# Patient Record
Sex: Female | Born: 1975
Health system: Southern US, Community
[De-identification: ages and names within clinical notes are randomized; demographics above are authoritative.]

## PROBLEM LIST (undated history)

## (undated) DIAGNOSIS — F329 Major depressive disorder, single episode, unspecified: Secondary | ICD-10-CM

## (undated) DIAGNOSIS — F32A Depression, unspecified: Secondary | ICD-10-CM

## (undated) DIAGNOSIS — F419 Anxiety disorder, unspecified: Secondary | ICD-10-CM

## (undated) HISTORY — DX: Anxiety disorder, unspecified: F41.9

## (undated) HISTORY — DX: Major depressive disorder, single episode, unspecified: F32.9

## (undated) HISTORY — DX: Depression, unspecified: F32.A

---

## 2008-11-28 ENCOUNTER — Ambulatory Visit: Payer: Self-pay | Admitting: Family Medicine

## 2013-08-05 DIAGNOSIS — F339 Major depressive disorder, recurrent, unspecified: Secondary | ICD-10-CM | POA: Insufficient documentation

## 2013-08-05 DIAGNOSIS — F411 Generalized anxiety disorder: Secondary | ICD-10-CM | POA: Insufficient documentation

## 2013-08-05 DIAGNOSIS — M1990 Unspecified osteoarthritis, unspecified site: Secondary | ICD-10-CM | POA: Insufficient documentation

## 2013-08-05 DIAGNOSIS — L409 Psoriasis, unspecified: Secondary | ICD-10-CM | POA: Insufficient documentation

## 2015-04-27 DIAGNOSIS — Z139 Encounter for screening, unspecified: Secondary | ICD-10-CM | POA: Diagnosis not present

## 2015-04-27 DIAGNOSIS — Z1231 Encounter for screening mammogram for malignant neoplasm of breast: Secondary | ICD-10-CM | POA: Diagnosis not present

## 2015-04-30 DIAGNOSIS — H6121 Impacted cerumen, right ear: Secondary | ICD-10-CM | POA: Diagnosis not present

## 2015-04-30 DIAGNOSIS — Z6830 Body mass index (BMI) 30.0-30.9, adult: Secondary | ICD-10-CM | POA: Diagnosis not present

## 2015-04-30 DIAGNOSIS — F411 Generalized anxiety disorder: Secondary | ICD-10-CM | POA: Diagnosis not present

## 2015-11-06 DIAGNOSIS — Z23 Encounter for immunization: Secondary | ICD-10-CM | POA: Diagnosis not present

## 2015-11-24 DIAGNOSIS — G8929 Other chronic pain: Secondary | ICD-10-CM | POA: Diagnosis not present

## 2015-11-24 DIAGNOSIS — M25521 Pain in right elbow: Secondary | ICD-10-CM | POA: Diagnosis not present

## 2015-11-24 DIAGNOSIS — J40 Bronchitis, not specified as acute or chronic: Secondary | ICD-10-CM | POA: Diagnosis not present

## 2015-11-24 DIAGNOSIS — J329 Chronic sinusitis, unspecified: Secondary | ICD-10-CM | POA: Diagnosis not present

## 2016-01-28 DIAGNOSIS — R05 Cough: Secondary | ICD-10-CM | POA: Diagnosis not present

## 2016-04-01 DIAGNOSIS — J0100 Acute maxillary sinusitis, unspecified: Secondary | ICD-10-CM | POA: Diagnosis not present

## 2016-04-01 DIAGNOSIS — M722 Plantar fascial fibromatosis: Secondary | ICD-10-CM | POA: Diagnosis not present

## 2016-04-01 DIAGNOSIS — F411 Generalized anxiety disorder: Secondary | ICD-10-CM | POA: Diagnosis not present

## 2016-04-01 DIAGNOSIS — F329 Major depressive disorder, single episode, unspecified: Secondary | ICD-10-CM | POA: Diagnosis not present

## 2016-05-03 DIAGNOSIS — Z1231 Encounter for screening mammogram for malignant neoplasm of breast: Secondary | ICD-10-CM | POA: Diagnosis not present

## 2016-06-24 DIAGNOSIS — H9192 Unspecified hearing loss, left ear: Secondary | ICD-10-CM | POA: Diagnosis not present

## 2016-06-24 DIAGNOSIS — Z6833 Body mass index (BMI) 33.0-33.9, adult: Secondary | ICD-10-CM | POA: Diagnosis not present

## 2016-06-24 DIAGNOSIS — M722 Plantar fascial fibromatosis: Secondary | ICD-10-CM | POA: Diagnosis not present

## 2016-10-24 DIAGNOSIS — Z23 Encounter for immunization: Secondary | ICD-10-CM | POA: Diagnosis not present

## 2017-02-01 NOTE — Progress Notes (Signed)
Subjective:    Patient ID: Dawn Lutz, female    DOB: 1975-11-29, 42 y.o.   MRN: [id number]  HPI:  Dawn Lutz is here to establish as a new pt.  She is a pleasant 42 year old female.  PMH: Denies chronic medical conditions/daily rx medications. She has two complaints: 1) R knee pain- she sustained twisting/fall injury 5 months ago.  Pain/swelling resolved then about 2-3 months ago sx's returned and have now been occurring every 1.5- 2 weeks. When pain is present it is described as "aching/throbbing" and rated 8/10.  She also reports when knee pain/edema develops she will briefly exp low grade fever- recorded as high as 99.8 She denies any previous knee pain/injuries.  She denies numbness/tingling in R foot 2) L plantar fasciitis- dx'd 12 months ago.  Been treated with ice, "rolling", brief course in PT, however pain still present. She has not had full physical in years. She has lost >5 lbs in last 3 months with improved diet. She denies tobacco use and enjoys "a beer/night". She works remotely for [employer] and lives/care for her ailing parents.  Patient Care Team    Relationship Specialty Notifications Start End  Julaine Fusi, NP PCP - General Family Medicine  02/02/17     Patient Active Problem List   Diagnosis Date Noted  . Healthcare maintenance 02/02/2017  . Family history of diabetes mellitus 02/02/2017  . Recurrent pain of right knee 02/02/2017  . Plantar fasciitis, left 02/02/2017     History reviewed. No pertinent past medical history.   History reviewed. No pertinent surgical history.   Family History  Problem Relation Age of Onset  . Depression Mother   . Depression Father   . Alcohol abuse Maternal Grandfather   . Diabetes Paternal Grandfather      Social History   Substance and Sexual Activity  Drug Use No     Social History   Substance and Sexual Activity  Alcohol Use Yes  . Alcohol/week: 4.2 oz  . Types: 7 Cans of beer per  week     Social History   Tobacco Use  Smoking Status Never Smoker  Smokeless Tobacco Never Used     Outpatient Encounter Medications as of 02/02/2017  Medication Sig  . Multiple Vitamin (MULTIVITAMIN) tablet Take 1 tablet by mouth daily.  . SPRINTEC 28 0.25-35 MG-MCG tablet Take 1 tablet by mouth daily.   No facility-administered encounter medications on file as of 02/02/2017.     Allergies: Escitalopram oxalate and Bupropion  Body mass index is 34.9 kg/m.  Blood pressure 128/70, pulse 83, height 5' 4.25" (1.632 m), weight 204 lb 14.4 oz (92.9 kg), SpO2 94 %.      Review of Systems  Constitutional: Positive for fatigue. Negative for activity change, appetite change, chills, diaphoresis, fever and unexpected weight change.  HENT: Negative for congestion.   Eyes: Negative for visual disturbance.  Respiratory: Negative for cough, chest tightness, shortness of breath, wheezing and stridor.   Cardiovascular: Negative for chest pain, palpitations and leg swelling.  Gastrointestinal: Negative for abdominal distention, abdominal pain, blood in stool, constipation, diarrhea, nausea and vomiting.  Endocrine: Negative for cold intolerance, heat intolerance, polydipsia, polyphagia and polyuria.  Genitourinary: Negative for difficulty urinating and flank pain.  Musculoskeletal: Positive for arthralgias, gait problem, joint swelling and myalgias. Negative for back pain, neck pain and neck stiffness.  Skin: Negative for color change, pallor, rash and wound.  Neurological: Negative for dizziness and headaches.  Hematological:  Does not bruise/bleed easily.  Psychiatric/Behavioral: Positive for sleep disturbance. Negative for decreased concentration, dysphoric mood, hallucinations, self-injury and suicidal ideas. The patient is not nervous/anxious and is not hyperactive.        Objective:   Physical Exam  Constitutional: She is oriented to person, place, and time. She appears  well-developed and well-nourished. No distress.  Cardiovascular: Normal rate, regular rhythm, normal heart sounds and intact distal pulses.  No murmur heard. Pulmonary/Chest: Effort normal and breath sounds normal. No respiratory distress. She has no wheezes. She has no rales. She exhibits no tenderness.  Musculoskeletal: She exhibits edema and tenderness.       Right knee: She exhibits swelling and bony tenderness. She exhibits normal range of motion. Tenderness found. Medial joint line and MCL tenderness noted.  TTP  left plantar fascia  Neurological: She is alert and oriented to person, place, and time.  Skin: Skin is warm and dry. No rash noted. She is not diaphoretic. No erythema. No pallor.  Psychiatric: She has a normal mood and affect. Her behavior is normal. Judgment and thought content normal. Her speech is rapid and/or pressured. Cognition and memory are normal.  Vitals reviewed.         Assessment & Plan:   1. Healthcare maintenance   2. Family history of diabetes mellitus   3. Recurrent pain of right knee   4. Plantar fasciitis, left     Healthcare maintenance Increase water intake, strive for at least 100 ounces/day.   Follow Heart Healthy diet Increase regular exercise.  Recommend at least 30 minutes daily, 5 days per week of walking, jogging, biking, swimming, YouTube/Pinterest workout videos. Podiatry referral placed. Please schedule with complete physical with fasting labs in the next month or two. WELCOME TO THE PRACTICE!  Recurrent pain of right knee Right knee pain- continue resting, icing, elevating when pain/swelling flare's up. If pain/swelling/instablity continues will send for imaging.  Plantar fasciitis, left Podiatry referral placed.    FOLLOW-UP:  Return in about 6 weeks (around 03/16/2017) for CPE, Fasting Labs.

## 2017-02-02 ENCOUNTER — Ambulatory Visit (INDEPENDENT_AMBULATORY_CARE_PROVIDER_SITE_OTHER): Payer: BLUE CROSS/BLUE SHIELD | Admitting: Adult Health

## 2017-02-02 ENCOUNTER — Other Ambulatory Visit: Payer: Self-pay | Admitting: Adult Health

## 2017-02-02 ENCOUNTER — Encounter: Payer: Self-pay | Admitting: Adult Health

## 2017-02-02 VITALS — BP 128/70 | HR 83 | Ht 64.25 in | Wt 204.9 lb

## 2017-02-02 DIAGNOSIS — Z833 Family history of diabetes mellitus: Secondary | ICD-10-CM | POA: Diagnosis not present

## 2017-02-02 DIAGNOSIS — Z0000 Encounter for general adult medical examination without abnormal findings: Secondary | ICD-10-CM | POA: Diagnosis not present

## 2017-02-02 DIAGNOSIS — M722 Plantar fascial fibromatosis: Secondary | ICD-10-CM

## 2017-02-02 DIAGNOSIS — M25561 Pain in right knee: Secondary | ICD-10-CM

## 2017-02-02 NOTE — Assessment & Plan Note (Signed)
Podiatry referral placed.

## 2017-02-02 NOTE — Assessment & Plan Note (Signed)
Increase water intake, strive for at least 100 ounces/day.   Follow Heart Healthy diet Increase regular exercise.  Recommend at least 30 minutes daily, 5 days per week of walking, jogging, biking, swimming, YouTube/Pinterest workout videos. Podiatry referral placed. Please schedule with complete physical with fasting labs in the next month or two. WELCOME TO THE PRACTICE!

## 2017-02-02 NOTE — Assessment & Plan Note (Signed)
Right knee pain- continue resting, icing, elevating when pain/swelling flare's up. If pain/swelling/instablity continues will send for imaging.

## 2017-02-02 NOTE — Patient Instructions (Addendum)
Heart-Healthy Eating Plan Many factors influence your heart health, including eating and exercise habits. Heart (coronary) risk increases with abnormal blood fat (lipid) levels. Heart-healthy meal planning includes limiting unhealthy fats, increasing healthy fats, and making other small dietary changes. This includes maintaining a healthy body weight to help keep lipid levels within a normal range. What is my plan? Your health care provider recommends that you:  Get no more than __25___% of the total calories in your daily diet from fat.  Limit your intake of saturated fat to less than ___5___% of your total calories each day.  Limit the amount of cholesterol in your diet to less than _300__ mg per day.  What types of fat should I choose?  Choose healthy fats more often. Choose monounsaturated and polyunsaturated fats, such as olive oil and canola oil, flaxseeds, walnuts, almonds, and seeds.  Eat more omega-3 fats. Good choices include salmon, mackerel, sardines, tuna, flaxseed oil, and ground flaxseeds. Aim to eat fish at least two times each week.  Limit saturated fats. Saturated fats are primarily found in animal products, such as meats, butter, and cream. Plant sources of saturated fats include palm oil, palm kernel oil, and coconut oil.  Avoid foods with partially hydrogenated oils in them. These contain trans fats. Examples of foods that contain trans fats are stick margarine, some tub margarines, cookies, crackers, and other baked goods. What general guidelines do I need to follow?  Check food labels carefully to identify foods with trans fats or high amounts of saturated fat.  Fill one half of your plate with vegetables and green salads. Eat 4-5 servings of vegetables per day. A serving of vegetables equals 1 cup of raw leafy vegetables,  cup of raw or cooked cut-up vegetables, or  cup of vegetable juice.  Fill one fourth of your plate with whole grains. Look for the word "whole"  as the first word in the ingredient list.  Fill one fourth of your plate with lean protein foods.  Eat 4-5 servings of fruit per day. A serving of fruit equals one medium whole fruit,  cup of dried fruit,  cup of fresh, frozen, or canned fruit, or  cup of 100% fruit juice.  Eat more foods that contain soluble fiber. Examples of foods that contain this type of fiber are apples, broccoli, carrots, beans, peas, and barley. Aim to get 20-30 g of fiber per day.  Eat more home-cooked food and less restaurant, buffet, and fast food.  Limit or avoid alcohol.  Limit foods that are high in starch and sugar.  Avoid fried foods.  Cook foods by using methods other than frying. Baking, boiling, grilling, and broiling are all great options. Other fat-reducing suggestions include: ? Removing the skin from poultry. ? Removing all visible fats from meats. ? Skimming the fat off of stews, soups, and gravies before serving them. ? Steaming vegetables in water or broth.  Lose weight if you are overweight. Losing just 5-10% of your initial body weight can help your overall health and prevent diseases such as diabetes and heart disease.  Increase your consumption of nuts, legumes, and seeds to 4-5 servings per week. One serving of dried beans or legumes equals  cup after being cooked, one serving of nuts equals 1 ounces, and one serving of seeds equals  ounce or 1 tablespoon.  You may need to monitor your salt (sodium) intake, especially if you have high blood pressure. Talk with your health care provider or dietitian to get  more information about reducing sodium. What foods can I eat? Grains  Breads, including [place], white, pita, wheat, raisin, rye, oatmeal, and [place]. Tortillas that are neither fried nor made with lard or trans fat. Low-fat rolls, including hotdog and hamburger buns and English muffins. Biscuits. Muffins. Waffles. Pancakes. Light popcorn. Whole-grain cereals. Flatbread. Melba  toast. Pretzels. Breadsticks. Rusks. Low-fat snacks and crackers, including oyster, saltine, matzo, graham, animal, and rye. Rice and pasta, including brown rice and those that are made with whole wheat. Vegetables All vegetables. Fruits All fruits, but limit coconut. Meats and Other Protein Sources Lean, well-trimmed beef, veal, pork, and lamb. Chicken and [place] without skin. All fish and shellfish. Wild duck, rabbit, pheasant, and venison. Egg whites or low-cholesterol egg substitutes. Dried beans, peas, lentils, and tofu.Seeds and most nuts. Dairy Low-fat or nonfat cheeses, including ricotta, string, and mozzarella. Skim or 1% milk that is liquid, powdered, or evaporated. Buttermilk that is made with low-fat milk. Nonfat or low-fat yogurt. Beverages Mineral water. Diet carbonated beverages. Sweets and Desserts Sherbets and fruit ices. Honey, jam, marmalade, jelly, and syrups. Meringues and gelatins. Pure sugar candy, such as hard candy, jelly beans, gumdrops, mints, marshmallows, and small amounts of dark chocolate. [employer] Eat all sweets and desserts in moderation. Fats and Oils Nonhydrogenated (trans-free) margarines. Vegetable oils, including soybean, sesame, sunflower, olive, peanut, safflower, corn, canola, and cottonseed. Salad dressings or mayonnaise that are made with a vegetable oil. Limit added fats and oils that you use for cooking, baking, salads, and as spreads. Other Cocoa powder. Coffee and tea. All seasonings and condiments. The items listed above may not be a complete list of recommended foods or beverages. Contact your dietitian for more options. What foods are not recommended? Grains Breads that are made with saturated or trans fats, oils, or whole milk. Croissants. Butter rolls. Cheese breads. Sweet rolls. Donuts. Buttered popcorn. Chow mein noodles. High-fat crackers, such as cheese or butter crackers. Meats and Other Protein Sources Fatty meats, such as  hotdogs, short ribs, sausage, spareribs, bacon, ribeye roast or steak, and mutton. High-fat deli meats, such as salami and bologna. Caviar. Domestic duck and goose. Organ meats, such as kidney, liver, sweetbreads, brains, gizzard, chitterlings, and heart. Dairy Cream, sour cream, cream cheese, and creamed cottage cheese. Whole milk cheeses, including blue (bleu), Monte[place] Jack, [place], [place], American, [place], Swiss, [place], [place], and [place]. Whole or 2% milk that is liquid, evaporated, or condensed. Whole buttermilk. Cream sauce or high-fat cheese sauce. Yogurt that is made from whole milk. Beverages Regular sodas and drinks with added sugar. Sweets and Desserts Frosting. Pudding. Cookies. Cakes other than angel food cake. Candy that has milk chocolate or white chocolate, hydrogenated fat, butter, coconut, or unknown ingredients. Buttered syrups. Full-fat ice cream or ice cream drinks. Fats and Oils Gravy that has suet, meat fat, or shortening. Cocoa butter, hydrogenated oils, palm oil, coconut oil, palm kernel oil. These can often be found in baked products, candy, fried foods, nondairy creamers, and whipped toppings. Solid fats and shortenings, including bacon fat, salt pork, lard, and butter. Nondairy cream substitutes, such as coffee creamers and sour cream substitutes. Salad dressings that are made of unknown oils, cheese, or sour cream. The items listed above may not be a complete list of foods and beverages to avoid. Contact your dietitian for more information. This information is not intended to replace advice given to you by your health care provider. Make sure you discuss any questions you have with your health care  provider. Document Released: 10/13/2007 Document Revised: 07/24/2015 Document Reviewed: 06/27/2013 Elsevier Interactive Patient Education  2018 Elsevier Inc.   Plantar Fasciitis Plantar fasciitis is a painful foot condition that affects the heel. It occurs when the band  of tissue that connects the toes to the heel bone (plantar fascia) becomes irritated. This can happen after exercising too much or doing other repetitive activities (overuse injury). The pain from plantar fasciitis can range from mild irritation to severe pain that makes it difficult for you to walk or move. The pain is usually worse in the morning or after you have been sitting or lying down for a while. What are the causes? This condition may be caused by:  Standing for long periods of time.  Wearing shoes that do not fit.  Doing high-impact activities, including running, aerobics, and ballet.  Being overweight.  Having an abnormal way of walking (gait).  Having tight calf muscles.  Having high arches in your feet.  Starting a new athletic activity.  What are the signs or symptoms? The main symptom of this condition is heel pain. Other symptoms include:  Pain that gets worse after activity or exercise.  Pain that is worse in the morning or after resting.  Pain that goes away after you walk for a few minutes.  How is this diagnosed? This condition may be diagnosed based on your signs and symptoms. Your health care provider will also do a physical exam to check for:  A tender area on the bottom of your foot.  A high arch in your foot.  Pain when you move your foot.  Difficulty moving your foot.  You may also need to have imaging studies to confirm the diagnosis. These can include:  X-rays.  Ultrasound.  MRI.  How is this treated? Treatment for plantar fasciitis depends on the severity of the condition. Your treatment may include:  Rest, ice, and over-the-counter pain medicines to manage your pain.  Exercises to stretch your calves and your plantar fascia.  A splint that holds your foot in a stretched, upward position while you sleep (night splint).  Physical therapy to relieve symptoms and prevent problems in the future.  Cortisone injections to relieve  severe pain.  Extracorporeal shock wave therapy (ESWT) to stimulate damaged plantar fascia with electrical impulses. It is often used as a last resort before surgery.  Surgery, if other treatments have not worked after 12 months.  Follow these instructions at home:  Take medicines only as directed by your health care provider.  Avoid activities that cause pain.  Roll the bottom of your foot over a bag of ice or a bottle of cold water. Do this for 20 minutes, 3-4 times a day.  Perform simple stretches as directed by your health care provider.  Try wearing athletic shoes with air-sole or gel-sole cushions or soft shoe inserts.  Wear a night splint while sleeping, if directed by your health care provider.  Keep all follow-up appointments with your health care provider. How is this prevented?  Do not perform exercises or activities that cause heel pain.  Consider finding low-impact activities if you continue to have problems.  Lose weight if you need to. The best way to prevent plantar fasciitis is to avoid the activities that aggravate your plantar fascia. Contact a health care provider if:  Your symptoms do not go away after treatment with home care measures.  Your pain gets worse.  Your pain affects your ability to move or do your  daily activities. This information is not intended to replace advice given to you by your health care provider. Make sure you discuss any questions you have with your health care provider. Document Released: 09/28/2000 Document Revised: 06/08/2015 Document Reviewed: 11/13/2013 Elsevier Interactive Patient Education  2018 Elsevier Inc.   Knee Pain, Adult Knee pain in adults is common. It can be caused by many things, including:  Arthritis.  A fluid-filled sac (cyst) or growth in your knee.  An infection in your knee.  An injury that will not heal.  Damage, swelling, or irritation of the tissues that support your knee.  Knee pain is  usually not a sign of a serious problem. The pain may go away on its own with time and rest. If it does not, a health care provider may order tests to find the cause of the pain. These may include:  Imaging tests, such as an X-ray, MRI, or ultrasound.  Joint aspiration. In this test, fluid is removed from the knee.  Arthroscopy. In this test, a lighted tube is inserted into knee and an image is projected onto a TV screen.  A biopsy. In this test, a sample of tissue is removed from the body and studied under a microscope.  Follow these instructions at home: Pay attention to any changes in your symptoms. Take these actions to relieve your pain. Activity  Rest your knee.  Do not do things that cause pain or make pain worse.  Avoid high-impact activities or exercises, such as running, jumping rope, or doing jumping jacks. General instructions  Take over-the-counter and prescription medicines only as told by your health care provider.  Raise (elevate) your knee above the level of your heart when you are sitting or lying down.  Sleep with a pillow under your knee.  If directed, apply ice to the knee: ? Put ice in a plastic bag. ? Place a towel between your skin and the bag. ? Leave the ice on for 20 minutes, 2-3 times a day.  Ask your health care provider if you should wear an elastic knee support.  Lose weight if you are overweight. Extra weight can put pressure on your knee.  Do not use any products that contain nicotine or tobacco, such as cigarettes and e-cigarettes. Smoking may slow the healing of any bone and joint problems that you may have. If you need help quitting, ask your health care provider. Contact a health care provider if:  Your knee pain continues, changes, or gets worse.  You have a fever along with knee pain.  Your knee buckles or locks up.  Your knee swells, and the swelling becomes worse. Get help right away if:  Your knee feels warm to the  touch.  You cannot move your knee.  You have severe pain in your knee.  You have chest pain.  You have trouble breathing. Summary  Knee pain in adults is common. It can be caused by many things, including, arthritis, infection, cysts, or injury.  Knee pain is usually not a sign of a serious problem, but if it does not go away, a health care provider may perform tests to know the cause of the pain.  Pay attention to any changes in your symptoms. Relieve your pain with rest, medicines, light activity, and use of ice.  Get help if your pain continues or becomes very severe, or if your knee buckles or locks up, or if you have chest pain or trouble breathing. This information is not  intended to replace advice given to you by your health care provider. Make sure you discuss any questions you have with your health care provider. Document Released: 10/31/2006 Document Revised: 12/25/2015 Document Reviewed: 12/25/2015 Elsevier Interactive Patient Education  2018 [employer]  Increase water intake, strive for at least 100 ounces/day.   Follow Heart Healthy diet Increase regular exercise.  Recommend at least 30 minutes daily, 5 days per week of walking, jogging, biking, swimming, YouTube/Pinterest workout videos. Podiatry referral placed. Please schedule with complete physical with fasting labs in the next month or two. Right knee pain- continue resting, icing, elevating when pain/swelling flare's up. WELCOME TO THE PRACTICE!

## 2017-02-28 ENCOUNTER — Ambulatory Visit: Payer: BLUE CROSS/BLUE SHIELD | Admitting: Podiatry

## 2017-03-07 ENCOUNTER — Other Ambulatory Visit: Payer: Self-pay | Admitting: Podiatry

## 2017-03-07 ENCOUNTER — Ambulatory Visit (INDEPENDENT_AMBULATORY_CARE_PROVIDER_SITE_OTHER): Payer: BLUE CROSS/BLUE SHIELD | Admitting: Podiatry

## 2017-03-07 ENCOUNTER — Encounter: Payer: Self-pay | Admitting: Podiatry

## 2017-03-07 ENCOUNTER — Ambulatory Visit (INDEPENDENT_AMBULATORY_CARE_PROVIDER_SITE_OTHER): Payer: BLUE CROSS/BLUE SHIELD

## 2017-03-07 DIAGNOSIS — M722 Plantar fascial fibromatosis: Secondary | ICD-10-CM

## 2017-03-07 DIAGNOSIS — M779 Enthesopathy, unspecified: Secondary | ICD-10-CM

## 2017-03-07 MED ORDER — MELOXICAM 15 MG PO TABS: 15.0000 mg | ORAL_TABLET | Freq: Every day | ORAL | 1 refills | Status: AC

## 2017-03-09 NOTE — Progress Notes (Signed)
   Subjective: Patient presents today for pain and tenderness in the left arch that began several months ago. She states that it hurts in the mornings with the first steps out of bed. She has not done anything to treat the symptoms. Patient presents today for further treatment and evaluation.  No past medical history on file.   Objective: Physical Exam General: The patient is alert and oriented x3 in no acute distress.  Dermatology: Skin is warm, dry and supple bilateral lower extremities. Negative for open lesions or macerations bilateral.   Vascular: Dorsalis Pedis and Posterior Tibial pulses palpable bilateral.  Capillary fill time is immediate to all digits.  Neurological: Epicritic and protective threshold intact bilateral.   Musculoskeletal: Tenderness to palpation at the medial calcaneal tubercale and through the insertion of the plantar fascia of the left foot. All other joints range of motion within normal limits bilateral. Strength 5/5 in all groups bilateral.   Radiographic exam:   Normal osseous mineralization. Joint spaces preserved. No fracture/dislocation/boney destruction. Calcaneal spur present with mild thickening of plantar fascia left. No other soft tissue abnormalities or radiopaque foreign bodies.   Assessment: 1. Plantar fasciitis left foot  Plan of Care:  1. Patient evaluated. Xrays reviewed.   2. Injection of 0.5cc Celestone soluspan injected into the left plantar fascia.  3. Rx for Mobic 15 mg ordered for patient. 4. Plantar fascial band(s) dispensed  5. Instructed patient regarding therapies and modalities at home to alleviate symptoms.  6. Return to clinic in 4 weeks.    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    Felecia ShellingBrent M. Evans, DPM [facility]  Dr. Felecia ShellingBrent M. Evans, DPM    [address]                                     [place], [place] [id number]                Office [phone]  Fax [phone]

## 2017-03-16 ENCOUNTER — Other Ambulatory Visit: Payer: Self-pay

## 2017-03-16 ENCOUNTER — Other Ambulatory Visit (INDEPENDENT_AMBULATORY_CARE_PROVIDER_SITE_OTHER): Payer: BLUE CROSS/BLUE SHIELD

## 2017-03-16 DIAGNOSIS — Z0000 Encounter for general adult medical examination without abnormal findings: Secondary | ICD-10-CM

## 2017-03-16 DIAGNOSIS — Z833 Family history of diabetes mellitus: Secondary | ICD-10-CM

## 2017-03-17 LAB — VITAMIN D 25 HYDROXY (VIT D DEFICIENCY, FRACTURES): Vit D, 25-Hydroxy: 38.7 ng/mL (ref 30.0–100.0)

## 2017-03-17 LAB — CBC WITH DIFFERENTIAL/PLATELET
Basophils Absolute: 0 10*3/uL (ref 0.0–0.2)
Basos: 0 %
EOS (ABSOLUTE): 0.3 10*3/uL (ref 0.0–0.4)
Eos: 3 %
Hematocrit: 40.5 % (ref 34.0–46.6)
Hemoglobin: 13.5 g/dL (ref 11.1–15.9)
Immature Grans (Abs): 0 10*3/uL (ref 0.0–0.1)
Immature Granulocytes: 0 %
Lymphocytes Absolute: 3 10*3/uL (ref 0.7–3.1)
Lymphs: 34 %
MCH: 31.5 pg (ref 26.6–33.0)
MCHC: 33.3 g/dL (ref 31.5–35.7)
MCV: 94 fL (ref 79–97)
Monocytes Absolute: 0.6 10*3/uL (ref 0.1–0.9)
Monocytes: 7 %
Neutrophils Absolute: 5 10*3/uL (ref 1.4–7.0)
Neutrophils: 56 %
Platelets: 301 10*3/uL (ref 150–379)
RBC: 4.29 x10E6/uL (ref 3.77–5.28)
RDW: 13.8 % (ref 12.3–15.4)
WBC: 8.8 10*3/uL (ref 3.4–10.8)

## 2017-03-17 LAB — COMPREHENSIVE METABOLIC PANEL
ALT: 14 IU/L (ref 0–32)
AST: 13 IU/L (ref 0–40)
Albumin/Globulin Ratio: 1.5 (ref 1.2–2.2)
Albumin: 3.9 g/dL (ref 3.5–5.5)
Alkaline Phosphatase: 60 IU/L (ref 39–117)
BUN/Creatinine Ratio: 13 (ref 9–23)
BUN: 9 mg/dL (ref 6–24)
Bilirubin Total: 0.2 mg/dL (ref 0.0–1.2)
CO2: 23 mmol/L (ref 20–29)
Calcium: 9 mg/dL (ref 8.7–10.2)
Chloride: 105 mmol/L (ref 96–106)
Creatinine, Ser: 0.69 mg/dL (ref 0.57–1.00)
GFR calc Af Amer: 122 mL/min/{1.73_m2} (ref >59)
GFR calc non Af Amer: 106 mL/min/{1.73_m2} (ref >59)
Globulin, Total: 2.6 g/dL (ref 1.5–4.5)
Glucose: 87 mg/dL (ref 65–99)
Potassium: 4.5 mmol/L (ref 3.5–5.2)
Sodium: 140 mmol/L (ref 134–144)
Total Protein: 6.5 g/dL (ref 6.0–8.5)

## 2017-03-17 LAB — TSH: TSH: 4.28 u[IU]/mL (ref 0.450–4.500)

## 2017-03-17 LAB — HEMOGLOBIN A1C
Est. average glucose Bld gHb Est-mCnc: 105 mg/dL
Hgb A1c MFr Bld: 5.3 % (ref 4.8–5.6)

## 2017-03-17 LAB — LIPID PANEL
Chol/HDL Ratio: 3 ratio (ref 0.0–4.4)
Cholesterol, Total: 190 mg/dL (ref 100–199)
HDL: 64 mg/dL (ref >39)
LDL Calculated: 89 mg/dL (ref 0–99)
Triglycerides: 186 mg/dL — ABNORMAL HIGH (ref 0–149)
VLDL Cholesterol Cal: 37 mg/dL (ref 5–40)

## 2017-03-22 NOTE — Progress Notes (Signed)
Subjective:    Patient ID: Dawn Lutz, female    DOB: 1975-12-31, 42 y.o.   MRN: [id number]  HPI: 02/02/17 OV:  Dawn Lutz is here to establish as a new pt.  She is a pleasant 42 year old female.  PMH: Denies chronic medical conditions/daily rx medications. She has two complaints: 1) R knee pain- she sustained twisting/fall injury 5 months ago.  Pain/swelling resolved then about 2-3 months ago sx's returned and have now been occurring every 1.5- 2 weeks. When pain is present it is described as "aching/throbbing" and rated 8/10.  She also reports when knee pain/edema develops she will briefly exp low grade fever- recorded as high as 99.8 She denies any previous knee pain/injuries.  She denies numbness/tingling in R foot 2) L plantar fasciitis- dx'd 12 months ago.  Been treated with ice, "rolling", brief course in PT, however pain still present. She has not had full physical in years. She has lost >5 lbs in last 3 months with improved diet. She denies tobacco use and enjoys "a beer/night". She works remotely for [employer] and lives/care for her ailing parents.  03/22/17 OV: Dawn Lutz presents for CPE. She was seen by podiatrist 03/07/17- 0.505ml Celestone soluspan injected into L plantar fascia and started on Meloxicam 15mg  QD She reports dramatic reduction in L plantar and R knee pain. She has been able to resume regular walking 4/5 miles 5 days/week-GREAT! She follows heart healthy diet with the occasional splurge on "bread and potatoes". She continues to enjoy beer a few evenings a week Reviewed recent labs- all questions answered  Healthcare Maintenance: PAP- completed today Mammogram- ordered today   Patient Care Team    Relationship Specialty Notifications Start End  Julaine Fusianford, Tania Perrott D, NP PCP - General Family Medicine  02/02/17     Patient Active Problem List   Diagnosis Date Noted  . Screening for cervical cancer 03/23/2017  . Breast cancer screening  03/23/2017  . Healthcare maintenance 02/02/2017  . Family history of diabetes mellitus 02/02/2017  . Recurrent pain of right knee 02/02/2017  . Plantar fasciitis, left 02/02/2017  . Arthritis 08/05/2013  . Depression 08/05/2013  . Generalized anxiety disorder 08/05/2013  . Psoriasis 08/05/2013     History reviewed. No pertinent past medical history.   History reviewed. No pertinent surgical history.   Family History  Problem Relation Age of Onset  . Depression Mother   . Depression Father   . Alcohol abuse Maternal Grandfather   . Diabetes Paternal Grandfather      Social History   Substance and Sexual Activity  Drug Use No     Social History   Substance and Sexual Activity  Alcohol Use Yes  . Alcohol/week: 4.2 oz  . Types: 7 Cans of beer per week     Social History   Tobacco Use  Smoking Status Never Smoker  Smokeless Tobacco Never Used     Outpatient Encounter Medications as of 03/23/2017  Medication Sig  . meloxicam (MOBIC) 15 MG tablet Take 1 tablet (15 mg total) by mouth daily.  . Multiple Vitamin (MULTIVITAMIN) tablet Take 1 tablet by mouth daily.  . SPRINTEC 28 0.25-35 MG-MCG tablet Take 1 tablet by mouth daily.   No facility-administered encounter medications on file as of 03/23/2017.     Allergies: Escitalopram oxalate and Bupropion  Body mass index is 34.85 kg/m.  Blood pressure 112/80, pulse 85, height 5' 4.25" (1.632 m), weight 204 lb 9.6 oz (92.8 kg), SpO2 98 %.  Review of Systems  Constitutional: Positive for fatigue. Negative for activity change, appetite change, chills, diaphoresis, fever and unexpected weight change.  HENT: Negative for congestion.   Eyes: Negative for visual disturbance.  Respiratory: Negative for cough, chest tightness, shortness of breath, wheezing and stridor.   Cardiovascular: Negative for chest pain, palpitations and leg swelling.  Gastrointestinal: Negative for abdominal distention, abdominal pain, blood in  stool, constipation, diarrhea, nausea and vomiting.  Endocrine: Negative for cold intolerance, heat intolerance, polydipsia, polyphagia and polyuria.  Genitourinary: Negative for difficulty urinating and flank pain.  Musculoskeletal: Positive for arthralgias, gait problem, joint swelling and myalgias. Negative for back pain, neck pain and neck stiffness.  Skin: Negative for color change, pallor, rash and wound.  Neurological: Negative for dizziness and headaches.  Hematological: Does not bruise/bleed easily.  Psychiatric/Behavioral: Positive for sleep disturbance. Negative for decreased concentration, dysphoric mood, hallucinations, self-injury and suicidal ideas. The patient is not nervous/anxious and is not hyperactive.        Objective:   Physical Exam  Constitutional: She is oriented to person, place, and time. She appears well-developed and well-nourished. No distress.  HENT:  Head: Normocephalic and atraumatic.  Right Ear: Hearing, tympanic membrane, external ear and ear canal normal. Tympanic membrane is not erythematous and not bulging. No decreased hearing is noted.  Left Ear: Hearing, tympanic membrane, external ear and ear canal normal. Tympanic membrane is not erythematous and not bulging. No decreased hearing is noted.  Nose: Nose normal. No mucosal edema or rhinorrhea. Right sinus exhibits no maxillary sinus tenderness and no frontal sinus tenderness. Left sinus exhibits no maxillary sinus tenderness and no frontal sinus tenderness.  Mouth/Throat: Uvula is midline, oropharynx is clear and moist and mucous membranes are normal.  Eyes: Conjunctivae are normal. Pupils are equal, round, and reactive to light.  Neck: Normal range of motion. Neck supple.  Cardiovascular: Normal rate, regular rhythm, normal heart sounds and intact distal pulses.  No murmur heard. Pulmonary/Chest: Effort normal and breath sounds normal. No respiratory distress. She has no wheezes. She has no rales. She  exhibits no tenderness.  Abdominal: Soft. Bowel sounds are normal. She exhibits no distension and no mass. There is no tenderness. There is no rebound and no guarding. Hernia confirmed negative in the left inguinal area.  Genitourinary: There is no tenderness or lesion on the right labia. There is no tenderness or lesion on the left labia. No tenderness in the vagina. Vaginal discharge found.  Genitourinary Comments: Thick/white vaginal discharge  Musculoskeletal: She exhibits no edema or tenderness.       Right knee: She exhibits swelling and bony tenderness. She exhibits normal range of motion.  Lymphadenopathy:    She has no cervical adenopathy.  Neurological: She is alert and oriented to person, place, and time.  Skin: Skin is warm and dry. No rash noted. She is not diaphoretic. No erythema. No pallor.  Psychiatric: She has a normal mood and affect. Her behavior is normal. Judgment and thought content normal. Her speech is rapid and/or pressured. Cognition and memory are normal.  Vitals reviewed.         Assessment & Plan:   1. Need for HPV vaccination   2. Breast cancer screening   3. Screening for cervical cancer   4. Psoriasis   5. Healthcare maintenance     Psoriasis Treated with UV exposure via tanning bed She reports using sunscreen when tanning  Breast cancer screening Mammogram ordered  Healthcare maintenance Your recent lab work looks  good, blood pressure is excellent. GREAT JOB on resuming the walking program! We will call you when PAP results are available. Recommend annul physical with fasting labs.  Screening for cervical cancer PAP completed today    FOLLOW-UP:  Return in about 1 year (around 03/24/2018) for CPE, Fasting Labs.

## 2017-03-23 ENCOUNTER — Ambulatory Visit (INDEPENDENT_AMBULATORY_CARE_PROVIDER_SITE_OTHER): Payer: BLUE CROSS/BLUE SHIELD | Admitting: Adult Health

## 2017-03-23 ENCOUNTER — Encounter: Payer: Self-pay | Admitting: Adult Health

## 2017-03-23 ENCOUNTER — Other Ambulatory Visit (HOSPITAL_COMMUNITY)
Admission: RE | Admit: 2017-03-23 | Discharge: 2017-03-23 | Disposition: A | Discharge status: Home / Self Care | Payer: BLUE CROSS/BLUE SHIELD | Source: Ambulatory Visit | Attending: Adult Health | Admitting: Adult Health

## 2017-03-23 ENCOUNTER — Other Ambulatory Visit: Payer: Self-pay | Admitting: Adult Health

## 2017-03-23 VITALS — BP 112/80 | HR 85 | Ht 64.25 in | Wt 204.6 lb

## 2017-03-23 DIAGNOSIS — Z0000 Encounter for general adult medical examination without abnormal findings: Secondary | ICD-10-CM | POA: Diagnosis not present

## 2017-03-23 DIAGNOSIS — Z1231 Encounter for screening mammogram for malignant neoplasm of breast: Secondary | ICD-10-CM | POA: Diagnosis not present

## 2017-03-23 DIAGNOSIS — Z23 Encounter for immunization: Secondary | ICD-10-CM

## 2017-03-23 DIAGNOSIS — L409 Psoriasis, unspecified: Secondary | ICD-10-CM

## 2017-03-23 DIAGNOSIS — Z1239 Encounter for other screening for malignant neoplasm of breast: Secondary | ICD-10-CM

## 2017-03-23 DIAGNOSIS — Z124 Encounter for screening for malignant neoplasm of cervix: Secondary | ICD-10-CM | POA: Insufficient documentation

## 2017-03-23 NOTE — Assessment & Plan Note (Signed)
Mammogram ordered

## 2017-03-23 NOTE — Assessment & Plan Note (Signed)
Your recent lab work looks good, blood pressure is excellent. GREAT JOB on resuming the walking program! We will call you when PAP results are available. Recommend annul physical with fasting labs.

## 2017-03-23 NOTE — Assessment & Plan Note (Signed)
PAP completed today 

## 2017-03-23 NOTE — Patient Instructions (Signed)
Heart-Healthy Eating Plan Heart-healthy meal planning includes:  Limiting unhealthy fats.  Increasing healthy fats.  Making other small dietary changes.  You may need to talk with your doctor or a diet specialist (dietitian) to create an eating plan that is right for you. What types of fat should I choose?  Choose healthy fats. These include olive oil and canola oil, flaxseeds, walnuts, almonds, and seeds.  Eat more omega-3 fats. These include salmon, mackerel, sardines, tuna, flaxseed oil, and ground flaxseeds. Try to eat fish at least twice each week.  Limit saturated fats. ? Saturated fats are often found in animal products, such as meats, butter, and cream. ? Plant sources of saturated fats include palm oil, palm kernel oil, and coconut oil.  Avoid foods with partially hydrogenated oils in them. These include stick margarine, some tub margarines, cookies, crackers, and other baked goods. These contain trans fats. What general guidelines do I need to follow?  Check food labels carefully. Identify foods with trans fats or high amounts of saturated fat.  Fill one half of your plate with vegetables and green salads. Eat 4-5 servings of vegetables per day. A serving of vegetables is: ? 1 cup of raw leafy vegetables. ?  cup of raw or cooked cut-up vegetables. ?  cup of vegetable juice.  Fill one fourth of your plate with whole grains. Look for the word "whole" as the first word in the ingredient list.  Fill one fourth of your plate with lean protein foods.  Eat 4-5 servings of fruit per day. A serving of fruit is: ? One medium whole fruit. ?  cup of dried fruit. ?  cup of fresh, frozen, or canned fruit. ?  cup of 100% fruit juice.  Eat more foods that contain soluble fiber. These include apples, broccoli, carrots, beans, peas, and barley. Try to get 20-30 g of fiber per day.  Eat more home-cooked food. Eat less restaurant, buffet, and fast food.  Limit or avoid  alcohol.  Limit foods high in starch and sugar.  Avoid fried foods.  Avoid frying your food. Try baking, boiling, grilling, or broiling it instead. You can also reduce fat by: ? Removing the skin from poultry. ? Removing all visible fats from meats. ? Skimming the fat off of stews, soups, and gravies before serving them. ? Steaming vegetables in water or broth.  Lose weight if you are overweight.  Eat 4-5 servings of nuts, legumes, and seeds per week: ? One serving of dried beans or legumes equals  cup after being cooked. ? One serving of nuts equals 1 ounces. ? One serving of seeds equals  ounce or one tablespoon.  You may need to keep track of how much salt or sodium you eat. This is especially true if you have high blood pressure. Talk with your doctor or dietitian to get more information. What foods can I eat? Grains Breads, including French, white, pita, wheat, raisin, rye, oatmeal, and Italian. Tortillas that are neither fried nor made with lard or trans fat. Low-fat rolls, including hotdog and hamburger buns and English muffins. Biscuits. Muffins. Waffles. Pancakes. Light popcorn. Whole-grain cereals. Flatbread. Melba toast. Pretzels. Breadsticks. Rusks. Low-fat snacks. Low-fat crackers, including oyster, saltine, matzo, graham, animal, and rye. Rice and pasta, including brown rice and pastas that are made with whole wheat. Vegetables All vegetables. Fruits All fruits, but limit coconut. Meats and Other Protein Sources Lean, well-trimmed beef, veal, pork, and lamb. Chicken and turkey without skin. All fish and shellfish.   Wild duck, rabbit, pheasant, and venison. Egg whites or low-cholesterol egg substitutes. Dried beans, peas, lentils, and tofu. Seeds and most nuts. Dairy Low-fat or nonfat cheeses, including ricotta, string, and mozzarella. Skim or 1% milk that is liquid, powdered, or evaporated. Buttermilk that is made with low-fat milk. Nonfat or low-fat  yogurt. Beverages Mineral water. Diet carbonated beverages. Sweets and Desserts Sherbets and fruit ices. Honey, jam, marmalade, jelly, and syrups. Meringues and gelatins. Pure sugar candy, such as hard candy, jelly beans, gumdrops, mints, marshmallows, and small amounts of dark chocolate. [employer] food cake. Eat all sweets and desserts in moderation. Fats and Oils Nonhydrogenated (trans-free) margarines. Vegetable oils, including soybean, sesame, sunflower, olive, peanut, safflower, corn, canola, and cottonseed. Salad dressings or mayonnaise made with a vegetable oil. Limit added fats and oils that you use for cooking, baking, salads, and as spreads. Other Cocoa powder. Coffee and tea. All seasonings and condiments. The items listed above may not be a complete list of recommended foods or beverages. Contact your dietitian for more options. What foods are not recommended? Grains Breads that are made with saturated or trans fats, oils, or whole milk. Croissants. Butter rolls. Cheese breads. Sweet rolls. Donuts. Buttered popcorn. Chow mein noodles. High-fat crackers, such as cheese or butter crackers. Meats and Other Protein Sources Fatty meats, such as hotdogs, short ribs, sausage, spareribs, bacon, rib eye roast or steak, and mutton. High-fat deli meats, such as salami and bologna. Caviar. Domestic duck and goose. Organ meats, such as kidney, liver, sweetbreads, and heart. Dairy Cream, sour cream, cream cheese, and creamed cottage cheese. Whole-milk cheeses, including blue (bleu), [address], [place], [place], [address], [place], [address], cheddar, [place], and [place]. Whole or 2% milk that is liquid, evaporated, or condensed. Whole buttermilk. Cream sauce or high-fat cheese sauce. Yogurt that is made from whole milk. Beverages Regular sodas and juice drinks with added sugar. Sweets and Desserts Frosting. Pudding. Cookies. Cakes other than angel food cake. Candy that has milk chocolate or white  chocolate, hydrogenated fat, butter, coconut, or unknown ingredients. Buttered syrups. Full-fat ice cream or ice cream drinks. Fats and Oils Gravy that has suet, meat fat, or shortening. Cocoa butter, hydrogenated oils, palm oil, coconut oil, palm kernel oil. These can often be found in baked products, candy, fried foods, nondairy creamers, and whipped toppings. Solid fats and shortenings, including bacon fat, salt pork, lard, and butter. Nondairy cream substitutes, such as coffee creamers and sour cream substitutes. Salad dressings that are made of unknown oils, cheese, or sour cream. The items listed above may not be a complete list of foods and beverages to avoid. Contact your dietitian for more information. This information is not intended to replace advice given to you by your health care provider. Make sure you discuss any questions you have with your health care provider. Document Released: 07/05/2011 Document Revised: 06/11/2015 Document Reviewed: 06/27/2013 Elsevier Interactive Patient Education  [employer]2018 Elsevier Inc.  Your recent lab work looks good, blood pressure is excellent. GREAT JOB on resuming the walking program! We will call you when PAP results are available. Recommend annul physical with fasting labs. NICE TO SEE YOU!

## 2017-03-23 NOTE — Assessment & Plan Note (Signed)
Treated with UV exposure via tanning bed She reports using sunscreen when tanning

## 2017-03-24 LAB — CYTOLOGY - PAP: Diagnosis: NEGATIVE

## 2017-03-31 ENCOUNTER — Ambulatory Visit: Payer: BLUE CROSS/BLUE SHIELD

## 2017-04-04 ENCOUNTER — Ambulatory Visit
Admission: RE | Admit: 2017-04-04 | Discharge: 2017-04-04 | Disposition: A | Discharge status: Home / Self Care | Payer: BLUE CROSS/BLUE SHIELD | Source: Ambulatory Visit | Attending: Adult Health | Admitting: Adult Health

## 2017-04-04 DIAGNOSIS — Z1231 Encounter for screening mammogram for malignant neoplasm of breast: Secondary | ICD-10-CM | POA: Diagnosis not present

## 2017-04-11 ENCOUNTER — Ambulatory Visit: Payer: BLUE CROSS/BLUE SHIELD | Admitting: Podiatry

## 2017-04-18 ENCOUNTER — Other Ambulatory Visit: Payer: Self-pay | Admitting: *Deleted

## 2017-04-18 ENCOUNTER — Inpatient Hospital Stay
Admission: RE | Admit: 2017-04-18 | Discharge: 2017-04-18 | Disposition: A | Discharge status: Home / Self Care | Payer: Self-pay | Source: Ambulatory Visit | Attending: *Deleted | Admitting: *Deleted

## 2017-04-18 DIAGNOSIS — Z9289 Personal history of other medical treatment: Secondary | ICD-10-CM

## 2017-05-19 ENCOUNTER — Ambulatory Visit: Payer: BLUE CROSS/BLUE SHIELD

## 2017-07-21 ENCOUNTER — Encounter: Payer: Self-pay | Admitting: Adult Health

## 2017-07-24 ENCOUNTER — Other Ambulatory Visit: Payer: Self-pay | Admitting: Adult Health

## 2017-07-24 MED ORDER — SPRINTEC 28 0.25-35 MG-MCG PO TABS: 1.0000 | ORAL_TABLET | Freq: Every day | ORAL | 11 refills | Status: DC

## 2017-12-05 NOTE — Progress Notes (Signed)
Subjective:    Patient ID: Dawn Lutz, female    DOB: 04-09-1975, 42 y.o.   MRN: [id number]  HPI:  Dawn Lutz presents with two complaints 1) low grade fever that started 3 months ago. Fever will last 30 mins then self resolve without antipyretic.  Fever will occur once every 1-3 days, has not occurred in last 2 weeks. She reports night sweats that last few months as well. LMP >4 months ago She has lost 13 lbs since last OV, however intentional due to improved eating and regular exercise (1 hr walking 3 times per week) Prior to onset of fever she denies travel outside [place], known exposure to TB, or visiting anyone in prison. 2) Increase in depression/anxiety the last 3 months r/t increase in stress at work and home. Elderly parents live with her and her father's health is in active decline. 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 She reports increase in fatigue and lack of interest in hobbies, activities She has hx of depression/anxiety and has been on Effexor, Zoloft Lexapro, and Prozac and reports Zoloft with best response, denies SE with this SSRI She denies suicidal ideation She denies tobacco/vape use and enjoys one craft beer/night She reports mild/intermittent insomnia   Patient Care Team    Relationship Specialty Notifications Start End  Adabella Stanis, Jinny Blossom, NP PCP - General Family Medicine  02/02/17     Patient Active Problem List   Diagnosis Date Noted  . Low grade fever 12/07/2017  . Screening for cervical cancer 03/23/2017  . Breast cancer screening 03/23/2017  . Healthcare maintenance 02/02/2017  . Family history of diabetes mellitus 02/02/2017  . Recurrent pain of right knee 02/02/2017  . Plantar fasciitis, left 02/02/2017  . Arthritis 08/05/2013  . Depression, recurrent (HCC) 08/05/2013  . GAD (generalized anxiety disorder) 08/05/2013  . Psoriasis 08/05/2013     History reviewed. No pertinent past medical  history.   History reviewed. No pertinent surgical history.   Family History  Problem Relation Age of Onset  . Depression Mother   . Depression Father   . Alcohol abuse Maternal Grandfather   . Diabetes Paternal Grandfather      Social History   Substance and Sexual Activity  Drug Use No     Social History   Substance and Sexual Activity  Alcohol Use Yes  . Alcohol/week: 7.0 standard drinks  . Types: 7 Cans of beer per week     Social History   Tobacco Use  Smoking Status Never Smoker  Smokeless Tobacco Never Used     Outpatient Encounter Medications as of 12/07/2017  Medication Sig  . Multiple Vitamin (MULTIVITAMIN) tablet Take 1 tablet by mouth daily.  . SPRINTEC 28 0.25-35 MG-MCG tablet Take 1 tablet by mouth daily.  . sertraline (ZOLOFT) 50 MG tablet Take 1 tablet (50 mg total) by mouth daily.   No facility-administered encounter medications on file as of 12/07/2017.     Allergies: Escitalopram oxalate and Bupropion  Body mass index is 32.56 kg/m.  Blood pressure 103/70, pulse 80, temperature 98.9 F (37.2 C), temperature source Oral, height 5' 4.25" (1.632 m), weight 191 lb 3.2 oz (86.7 kg), SpO2 100 %.  Review of Systems  Constitutional: Positive for fatigue. Negative for activity change, appetite change, chills, diaphoresis, fever and unexpected weight change.  HENT: Negative for congestion.   Eyes: Negative for visual disturbance.  Respiratory: Negative for cough, chest tightness, shortness of breath, wheezing and stridor.  Cardiovascular: Negative for chest pain, palpitations and leg swelling.  Gastrointestinal: Negative for abdominal distention, abdominal pain, blood in stool, constipation, diarrhea, nausea, rectal pain and vomiting.  Genitourinary: Positive for menstrual problem.  Musculoskeletal: Positive for arthralgias, joint swelling and myalgias.  Neurological: Negative for dizziness and headaches.  Hematological: Does not  bruise/bleed easily.  Psychiatric/Behavioral: Positive for agitation, dysphoric mood and sleep disturbance. Negative for behavioral problems, confusion, decreased concentration, hallucinations, self-injury and suicidal ideas. The patient is nervous/anxious. The patient is not hyperactive.        Objective:   Physical Exam  Constitutional: She is oriented to person, place, and time. She appears well-developed and well-nourished. No distress.  HENT:  Head: Normocephalic and atraumatic.  Right Ear: External ear normal.  Left Ear: External ear normal.  Nose: Nose normal.  Mouth/Throat: Oropharynx is clear and moist.  Eyes: Pupils are equal, round, and reactive to light. Conjunctivae and EOM are normal.  Cardiovascular: Normal rate, regular rhythm, normal heart sounds and intact distal pulses.  No murmur heard. Pulmonary/Chest: Effort normal and breath sounds normal. No stridor. No respiratory distress. She has no wheezes. She has no rales. She exhibits no tenderness.  Musculoskeletal: She exhibits edema and tenderness.       Right knee: She exhibits swelling. Tenderness found.  Neurological: She is alert and oriented to person, place, and time.  Skin: Skin is warm and dry. Capillary refill takes less than 2 seconds. No rash noted. She is not diaphoretic. No erythema. No pallor.  Psychiatric: Her behavior is normal. Judgment and thought content normal. Her mood appears anxious. Her speech is rapid and/or pressured. Cognition and memory are normal.  Well groomed  She is attentive.  Nursing note and vitals reviewed.      Assessment & Plan:   1. Low grade fever   2. Depression, recurrent (HCC)   3. GAD (generalized anxiety disorder)     Low grade fever CBC drawn today We will call you when lab results are available. If labs are normal, suggest referral to OB/GYN to address possible menopause causing low grade fever and other symptoms.   GAD (generalized anxiety disorder) Continue to  drink plenty of water and eat a healthy diet. Continue regular exercise. Referral to Behavioral Health placed. Please start once daily Sertraline (Zoloft) 50mg , follow-up in 4 weeks. Please try to get out for at least two fun activities per month. Call with any questions/concerns. Follow-up 4 weeks.   Depression, recurrent (HCC) Continue to drink plenty of water and eat a healthy diet. Continue regular exercise. Referral to Behavioral Health placed. Please start once daily Sertraline (Zoloft) 50mg , follow-up in 4 weeks. Please try to get out for at least two fun activities per month. Call with any questions/concerns. Follow-up 4 weeks.  Pt was in the office today for 30+ minutes, I spent >75% of time in face to face counseling of various medical concerns and in coordination of care  FOLLOW-UP:  Return in about 4 weeks (around 01/04/2018) for Evaluate Medication Effectiveness, Depression, General Anxiety Disorder.

## 2017-12-07 ENCOUNTER — Encounter: Payer: Self-pay | Admitting: Adult Health

## 2017-12-07 ENCOUNTER — Ambulatory Visit (INDEPENDENT_AMBULATORY_CARE_PROVIDER_SITE_OTHER): Payer: Self-pay | Admitting: Adult Health

## 2017-12-07 VITALS — BP 103/70 | HR 80 | Temp 98.9°F | Ht 64.25 in | Wt 191.2 lb

## 2017-12-07 DIAGNOSIS — F411 Generalized anxiety disorder: Secondary | ICD-10-CM

## 2017-12-07 DIAGNOSIS — F339 Major depressive disorder, recurrent, unspecified: Secondary | ICD-10-CM

## 2017-12-07 DIAGNOSIS — R509 Fever, unspecified: Secondary | ICD-10-CM

## 2017-12-07 MED ORDER — SERTRALINE HCL 50 MG PO TABS: 50.0000 mg | ORAL_TABLET | Freq: Every day | ORAL | 1 refills | Status: DC

## 2017-12-07 NOTE — Assessment & Plan Note (Addendum)
Continue to drink plenty of water and eat a healthy diet. Continue regular exercise. Referral to Behavioral Health placed. Please start once daily Sertraline (Zoloft) 50mg, follow-up in 4 weeks. Please try to get out for at least two fun activities per month. Call with any questions/concerns. Follow-up 4 weeks. 

## 2017-12-07 NOTE — Assessment & Plan Note (Addendum)
Continue to drink plenty of water and eat a healthy diet. Continue regular exercise. Referral to Behavioral Health placed. Please start once daily Sertraline (Zoloft) 50mg , follow-up in 4 weeks. Please try to get out for at least two fun activities per month. Call with any questions/concerns. Follow-up 4 weeks.

## 2017-12-07 NOTE — Assessment & Plan Note (Signed)
CBC drawn today We will call you when lab results are available. If labs are normal, suggest referral to OB/GYN to address possible menopause causing low grade fever and other symptoms.

## 2017-12-07 NOTE — Patient Instructions (Signed)
Persistent Depressive Disorder Persistent depressive disorder (PDD) is a mental health condition. PDD causes symptoms of low-level depression for 2 years or longer. It may also be called long-term (chronic) depression or dysthymia. PDD may include episodes of more severe depression that last for about 2 weeks (major depressive disorder or MDD). PDD can affect the way you think, feel, and sleep. This condition may also affect your relationships. You may be more likely to get sick if you have PDD. Symptoms of PDD occur for most of the day and may include:  Feeling tired (fatigue).  Low energy.  Eating too much or too little.  Sleeping too much or too little.  Feeling restless or agitated.  Feeling hopeless.  Feeling worthless or guilty.  Feeling worried or nervous (anxiety).  Trouble concentrating or making decisions.  Low self-esteem.  A negative way of looking at things (outlook).  Not being able to have fun or feel pleasure.  Avoiding interacting with people.  Getting angry or annoyed easily (irritability).  Acting aggressive or angry.  Follow these instructions at home: Activity  Go back to your normal activities as told by your doctor.  Exercise regularly as told by your doctor. General instructions  Take over-the-counter and prescription medicines only as told by your doctor.  Do not drink alcohol. Or, limit how much alcohol you drink to no more than 1 drink a day for nonpregnant women and 2 drinks a day for men. One drink equals 12 oz of beer, 5 oz of wine, or 1 oz of hard liquor. Alcohol can affect any antidepressant medicines you are taking. Talk with your doctor about your alcohol use.  Eat a healthy diet and get plenty of sleep.  Find activities that you enjoy each day.  Consider joining a support group. Your doctor may be able to suggest a support group.  Keep all follow-up visits as told by your doctor. This is important. Where to find more  information: [employer] Mental [employer]ness  www.nami.org  [employer] Mental Health  http://www.maynard.net/www.nimh.nih.gov  National Suicide Prevention Lifeline  52065425581-800-273-TALK 6606044942(1-(801)841-3822). This is free, 24-hour help.  Contact a doctor if:  Your symptoms get worse.  You have new symptoms.  You have trouble sleeping or doing your daily activities. Get help right away if:  You self-harm.  You have serious thoughts about hurting yourself or others.  You see, hear, taste, smell, or feel things that are not there (hallucinate). This information is not intended to replace advice given to you by your health care provider. Make sure you discuss any questions you have with your health care provider. Document Released: 12/15/2014 Document Revised: 08/28/2015 Document Reviewed: 08/28/2015 Elsevier Interactive Patient Education  2017 Elsevier Inc.   Generalized Anxiety Disorder, Adult Generalized anxiety disorder (GAD) is a mental health disorder. People with this condition constantly worry about everyday events. Unlike normal anxiety, worry related to GAD is not triggered by a specific event. These worries also do not fade or get better with time. GAD interferes with life functions, including relationships, work, and school. GAD can vary from mild to severe. People with severe GAD can have intense waves of anxiety with physical symptoms (panic attacks). What are the causes? The exact cause of GAD is not known. What increases the risk? This condition is more likely to develop in:  Women.  People who have a family history of anxiety disorders.  People who are very shy.  People who experience very stressful life events, such  as the death of a loved one.  People who have a very stressful family environment.  What are the signs or symptoms? People with GAD often worry excessively about many things in their lives, such as their health and family. They may also be overly  concerned about:  Doing well at work.  Being on time.  Natural disasters.  Friendships.  Physical symptoms of GAD include:  Fatigue.  Muscle tension or having muscle twitches.  Trembling or feeling shaky.  Being easily startled.  Feeling like your heart is pounding or racing.  Feeling out of breath or like you cannot take a deep breath.  Having trouble falling asleep or staying asleep.  Sweating.  Nausea, diarrhea, or irritable bowel syndrome (IBS).  Headaches.  Trouble concentrating or remembering facts.  Restlessness.  Irritability.  How is this diagnosed? Your health care provider can diagnose GAD based on your symptoms and medical history. You will also have a physical exam. The health care provider will ask specific questions about your symptoms, including how severe they are, when they started, and if they come and go. Your health care provider may ask you about your use of alcohol or drugs, including prescription medicines. Your health care provider may refer you to a mental health specialist for further evaluation. Your health care provider will do a thorough examination and may perform additional tests to rule out other possible causes of your symptoms. To be diagnosed with GAD, a person must have anxiety that:  Is out of his or her control.  Affects several different aspects of his or her life, such as work and relationships.  Causes distress that makes him or her unable to take part in normal activities.  Includes at least three physical symptoms of GAD, such as restlessness, fatigue, trouble concentrating, irritability, muscle tension, or sleep problems.  Before your health care provider can confirm a diagnosis of GAD, these symptoms must be present more days than they are not, and they must last for six months or longer. How is this treated? The following therapies are usually used to treat GAD:  Medicine. Antidepressant medicine is usually  prescribed for long-term daily control. Antianxiety medicines may be added in severe cases, especially when panic attacks occur.  Talk therapy (psychotherapy). Certain types of talk therapy can be helpful in treating GAD by providing support, education, and guidance. Options include: ? Cognitive behavioral therapy (CBT). People learn coping skills and techniques to ease their anxiety. They learn to identify unrealistic or negative thoughts and behaviors and to replace them with positive ones. ? Acceptance and commitment therapy (ACT). This treatment teaches people how to be mindful as a way to cope with unwanted thoughts and feelings. ? Biofeedback. This process trains you to manage your body's response (physiological response) through breathing techniques and relaxation methods. You will work with a therapist while machines are used to monitor your physical symptoms.  Stress management techniques. These include yoga, meditation, and exercise.  A mental health specialist can help determine which treatment is best for you. Some people see improvement with one type of therapy. However, other people require a combination of therapies. Follow these instructions at home:  Take over-the-counter and prescription medicines only as told by your health care provider.  Try to maintain a normal routine.  Try to anticipate stressful situations and allow extra time to manage them.  Practice any stress management or self-calming techniques as taught by your health care provider.  Do not punish yourself for setbacks  or for not making progress.  Try to recognize your accomplishments, even if they are small.  Keep all follow-up visits as told by your health care provider. This is important. Contact a health care provider if:  Your symptoms do not get better.  Your symptoms get worse.  You have signs of depression, such as: ? A persistently sad, cranky, or irritable mood. ? Loss of enjoyment in  activities that used to bring you joy. ? Change in weight or eating. ? Changes in sleeping habits. ? Avoiding friends or family members. ? Loss of energy for normal tasks. ? Feelings of guilt or worthlessness. Get help right away if:  You have serious thoughts about hurting yourself or others. If you ever feel like you may hurt yourself or others, or have thoughts about taking your own life, get help right away. You can go to your nearest emergency department or call:  Your local emergency services (911 in the [place]  A suicide crisis helpline, such as the National Suicide Prevention Lifeline at (640)115-0995. This is open 24 hours a day.  Summary  Generalized anxiety disorder (GAD) is a mental health disorder that involves worry that is not triggered by a specific event.  People with GAD often worry excessively about many things in their lives, such as their health and family.  GAD may cause physical symptoms such as restlessness, trouble concentrating, sleep problems, frequent sweating, nausea, diarrhea, headaches, and trembling or muscle twitching.  A mental health specialist can help determine which treatment is best for you. Some people see improvement with one type of therapy. However, other people require a combination of therapies. This information is not intended to replace advice given to you by your health care provider. Make sure you discuss any questions you have with your health care provider. Document Released: 04/30/2012 Document Revised: 11/24/2015 Document Reviewed: 11/24/2015 Elsevier Interactive Patient Education  2018 [employer].  Continue to drink plenty of water and eat a healthy diet. Continue regular exercise. GREAT JOB on your blood pressure and weight loss! We will call you when lab results are available. If labs are normal, suggest referral to OB/GYN to address possible menopause causing low grade fever and other symptoms. Referral to Behavioral Health  placed. Please start once daily Sertraline (Zoloft) 50mg , follow-up in 4 weeks. Please try to get out for at least two fun activities per month. Call with any questions/concerns. Follow-up 4 weeks. NICE TO SEE YOU!

## 2017-12-08 LAB — CBC WITH DIFFERENTIAL/PLATELET
Basophils Absolute: 0 10*3/uL (ref 0.0–0.2)
Basos: 0 %
EOS (ABSOLUTE): 0.1 10*3/uL (ref 0.0–0.4)
Eos: 1 %
Hematocrit: 40.5 % (ref 34.0–46.6)
Hemoglobin: 13.8 g/dL (ref 11.1–15.9)
Immature Grans (Abs): 0 10*3/uL (ref 0.0–0.1)
Immature Granulocytes: 0 %
Lymphocytes Absolute: 2.1 10*3/uL (ref 0.7–3.1)
Lymphs: 24 %
MCH: 32.1 pg (ref 26.6–33.0)
MCHC: 34.1 g/dL (ref 31.5–35.7)
MCV: 94 fL (ref 79–97)
Monocytes Absolute: 0.6 10*3/uL (ref 0.1–0.9)
Monocytes: 7 %
Neutrophils Absolute: 6 10*3/uL (ref 1.4–7.0)
Neutrophils: 68 %
Platelets: 293 10*3/uL (ref 150–450)
RBC: 4.3 x10E6/uL (ref 3.77–5.28)
RDW: 12.1 % — ABNORMAL LOW (ref 12.3–15.4)
WBC: 8.9 10*3/uL (ref 3.4–10.8)

## 2017-12-11 ENCOUNTER — Telehealth: Payer: Self-pay | Admitting: Obstetrics and Gynecology

## 2017-12-11 ENCOUNTER — Encounter: Payer: Self-pay | Admitting: Obstetrics and Gynecology

## 2017-12-11 ENCOUNTER — Telehealth: Payer: Self-pay

## 2017-12-11 DIAGNOSIS — N951 Menopausal and female climacteric states: Secondary | ICD-10-CM

## 2017-12-11 NOTE — Telephone Encounter (Signed)
Pt informed of results.  Pt expressed understanding and is agreeable. [facility]  Tiajuana Amass. Avrey Flanagin, CMA

## 2017-12-11 NOTE — Telephone Encounter (Signed)
Called and left a message for patient to call back to schedule a new patient doctor referral appointment with our office too see any provider for: Menopausal symptoms.

## 2017-12-26 ENCOUNTER — Ambulatory Visit: Payer: BLUE CROSS/BLUE SHIELD | Admitting: Obstetrics and Gynecology

## 2017-12-26 ENCOUNTER — Encounter: Payer: Self-pay | Admitting: Obstetrics and Gynecology

## 2017-12-26 ENCOUNTER — Other Ambulatory Visit: Payer: Self-pay

## 2017-12-26 VITALS — BP 102/64 | HR 80 | Resp 16 | Ht 64.5 in | Wt 186.2 lb

## 2017-12-26 DIAGNOSIS — N951 Menopausal and female climacteric states: Secondary | ICD-10-CM | POA: Diagnosis not present

## 2017-12-26 NOTE — Progress Notes (Signed)
42 y.o. G0P0000 Single Caucasian female here for new patient exam.   (Had her annual exam with her PCP.) Having menopausal symptoms.   Reporting a low grade fever for a couple of months.  Having hot flashes.  States her temp is about 100 degrees F.  TSH normal in Feb 2019.   Having a lot of mood swings.  Back on Zoloft, which is helping a lot.   States her periods are abnormal.  Has brown blood with her cycle most of the time and then an occasional bad period "like a teenager.  Currently taking birth control pills.  Taking Sprintec COCs.  No bleeding in between cycles.  Not certain if she is having more hot flashes on her placebo pills.  No cramping.   Decreased sex drive.  Some vaginal dryness.   Does have some vaginal odor issues.  Using boric acid suppositories.   Denies tobacco use, HTN, migraine HA with aura, liver or breast disease, DVT or PE.  Mat GF had PE.   Mother with menopause age 43 - 78.   PCP:  William Hamburger, NP   Patient's last menstrual period was 12/07/2017 (lmp unknown).           Sexually active: Yes.    The current method of family planning is OCP (estrogen/progesterone).    Exercising: Yes.    walking, hiking Smoker:  no  Health Maintenance: Pap:  03/23/17 Neg.  No HPV testing done.  History of abnormal Pap:  no MMG:  04/04/17 BIRADS1:Neg  Colonoscopy:  Never BMD:   Never  TDaP:  2015 Gardasil:   Had 1 HIV: unsure Hep C: unsure Screening Labs: PCP   reports that she has never smoked. She has never used smokeless tobacco. She reports that she drinks about 7.0 standard drinks of alcohol per week. She reports that she does not use drugs.  Past Medical History:  Diagnosis Date  . Anxiety   . Depression     History reviewed. No pertinent surgical history.  Current Outpatient Medications  Medication Sig Dispense Refill  . Multiple Vitamin (MULTIVITAMIN) tablet Take 1 tablet by mouth daily.    . sertraline (ZOLOFT) 50 MG tablet Take 1  tablet (50 mg total) by mouth daily. 30 tablet 1  . SPRINTEC 28 0.25-35 MG-MCG tablet Take 1 tablet by mouth daily. 1 Package 11   No current facility-administered medications for this visit.     Family History  Problem Relation Age of Onset  . Depression Mother   . Depression Father   . Alcohol abuse Maternal Grandfather   . Diabetes Paternal Grandfather     Review of Systems  All other systems reviewed and are negative.   Exam:   BP 102/64 (BP Location: Right Arm, Patient Position: Sitting, Cuff Size: Large)   Pulse 80   Resp 16   Ht 5' 4.5" (1.638 m)   Wt 186 lb 3.2 oz (84.5 kg)   LMP 12/07/2017 (LMP Unknown)   BMI 31.47 kg/m     General appearance: alert, cooperative and appears stated age Head: Normocephalic, without obvious abnormality, atraumatic Neck: no adenopathy, supple, symmetrical, trachea midline and thyroid normal to inspection and palpation Lungs: clear to auscultation bilaterally Heart: regular rate and rhythm Abdomen: soft, non-tender; no masses, no organomegaly Extremities: extremities normal, atraumatic, no cyanosis or edema Skin: Skin color, texture, turgor normal. No rashes or lesions Lymph nodes: Cervical, supraclavicular, and axillary nodes normal. No abnormal inguinal nodes palpated Neurologic: Grossly normal  Pelvic:  External genitalia:  no lesions              Urethra:  normal appearing urethra with no masses, tenderness or lesions              Bartholins and Skenes: normal                 Vagina: normal appearing vagina with normal color and discharge, no lesions              Cervix: no lesions             Bimanual Exam:  Uterus:  normal size, contour, position, consistency, mobility, non-tender              Adnexa: no mass, fullness, tenderness              Chaperone was present for exam.  Assessment:   Well woman visit with normal exam. Menopausal symptoms.  On COCs. Hx anxiety and depression.   Plan:   Discussed perimenopause  and menopause.  We reviewed changes including reproductive functioning, sexual functioning, cardiac health, and bone health.  She will come off her COCs for 2 weeks and [facility].  She understands this will not predict if she will have a future period, however, as levels fluctuate significantly in perimenopause.  We may start lowering her COC dosage if appropriate and select a pill like Loestrin 24 or Mircette for extended estrogen exposure.  Questions invited and answered. Menopause brochure to patient.    __30_____ minutes face to face time of which over 50% was spent in counseling.   After visit summary provided.

## 2017-12-30 ENCOUNTER — Encounter: Payer: Self-pay | Admitting: Adult Health

## 2018-01-04 ENCOUNTER — Ambulatory Visit: Payer: Self-pay | Admitting: Adult Health

## 2018-01-22 ENCOUNTER — Ambulatory Visit (INDEPENDENT_AMBULATORY_CARE_PROVIDER_SITE_OTHER): Payer: BLUE CROSS/BLUE SHIELD | Admitting: Adult Health

## 2018-01-22 ENCOUNTER — Encounter: Payer: Self-pay | Admitting: Adult Health

## 2018-01-22 VITALS — BP 98/66 | HR 93 | Temp 99.7°F | Ht 64.5 in | Wt 190.1 lb

## 2018-01-22 DIAGNOSIS — F339 Major depressive disorder, recurrent, unspecified: Secondary | ICD-10-CM

## 2018-01-22 DIAGNOSIS — J40 Bronchitis, not specified as acute or chronic: Secondary | ICD-10-CM | POA: Diagnosis not present

## 2018-01-22 DIAGNOSIS — Z0000 Encounter for general adult medical examination without abnormal findings: Secondary | ICD-10-CM

## 2018-01-22 DIAGNOSIS — F411 Generalized anxiety disorder: Secondary | ICD-10-CM | POA: Diagnosis not present

## 2018-01-22 MED ORDER — AZITHROMYCIN 250 MG PO TABS: ORAL_TABLET | ORAL | 0 refills | Status: DC

## 2018-01-22 MED ORDER — SERTRALINE HCL 50 MG PO TABS: 50.0000 mg | ORAL_TABLET | Freq: Every day | ORAL | 2 refills | Status: DC

## 2018-01-22 MED ORDER — HYDROCOD POLST-CPM POLST ER 10-8 MG/5ML PO SUER: 5.0000 mL | Freq: Two times a day (BID) | ORAL | 0 refills | Status: DC | PRN

## 2018-01-22 NOTE — Assessment & Plan Note (Signed)
Depression/anxiety- continue with once daily Sertraline 50mg. Continue to drink plenty of water and follow heart healthy diet.  

## 2018-01-22 NOTE — Assessment & Plan Note (Signed)
Depression/anxiety- continue with once daily Sertraline 50mg . Continue to drink plenty of water and follow heart healthy diet.

## 2018-01-22 NOTE — Assessment & Plan Note (Signed)
[place] Controlled Substance Database reviewed- no aberrancies noted For bronchitis- please take Azithromycin as directed and Tussionex as needed. Increase fluids/rest/vit c-2,000mg /daily when not feeling well. If coughing persists after antibiotic completed, then please call clinic.

## 2018-01-22 NOTE — Progress Notes (Signed)
Subjective:    Patient ID: Dawn Lutz, female    DOB: 1975/07/29, 43 y.o.   MRN: [id number]  HPI: 12/07/17 OV:  Dawn Lutz presents with two complaints 1) low grade fever that started 3 months ago. Fever will last 30 mins then self resolve without antipyretic.  Fever will occur once every 1-3 days, has not occurred in last 2 weeks. She reports night sweats that last few months as well. LMP >4 months ago She has lost 13 lbs since last OV, however intentional due to improved eating and regular exercise (1 hr walking 3 times per week) Prior to onset of fever she denies travel outside [place], known exposure to TB, or visiting anyone in prison. 2) Increase in depression/anxiety the last 3 months r/t increase in stress at work and home. Elderly parents live with her and her father's health is in active decline. 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 She reports increase in fatigue and lack of interest in hobbies, activities She has hx of depression/anxiety and has been on Effexor, Zoloft Lexapro, and Prozac and reports Zoloft with best response, denies SE with this SSRI She denies suicidal ideation She denies tobacco/vape use and enjoys one craft beer/night She reports mild/intermittent insomnia  01/22/2018 OV: Dawn Lutz presents with f/u: depression and anxiety and acute complaint- URI sx's She was started on Sertraline 2 months ago, has titrated up to 50mg  QD. She reports SIGNIFICANT reduction in depression, anxiety, and states "I just feel more even keel". She denies thoughts of harming herself/others She did gain 4 lbs since last OV, however it has been the holiday season  She reports father has been acutely ill with pneumonia She reports productive cough >2 weeks (thick/white mucus). She has also been experiencing thick/white nasal drainage, low grade fever (highest 13261f), and fever. She has been using OTC NyQuil and Robitussion-  both used at bedtime last night Current temp 99.7 f oral She denies CP/dyspnea/dizziness/HA/N/V/D She denies tobacco/vape/excessive ETOH use   Patient Care Team    Relationship Specialty Notifications Start End  Julaine Fusianford, [place] Kappes D, NP PCP - General Family Medicine  02/02/17     Patient Active Problem List   Diagnosis Date Noted  . Bronchitis 01/22/2018  . Low grade fever 12/07/2017  . Screening for cervical cancer 03/23/2017  . Breast cancer screening 03/23/2017  . Healthcare maintenance 02/02/2017  . Family history of diabetes mellitus 02/02/2017  . Recurrent pain of right knee 02/02/2017  . Plantar fasciitis, left 02/02/2017  . Arthritis 08/05/2013  . Depression, recurrent (HCC) 08/05/2013  . GAD (generalized anxiety disorder) 08/05/2013  . Psoriasis 08/05/2013     Past Medical History:  Diagnosis Date  . Anxiety   . Depression      History reviewed. No pertinent surgical history.   Family History  Problem Relation Age of Onset  . Depression Mother   . Depression Father   . Alcohol abuse Maternal Grandfather   . Diabetes Paternal Grandfather      Social History   Substance and Sexual Activity  Drug Use No     Social History   Substance and Sexual Activity  Alcohol Use Yes  . Alcohol/week: 7.0 standard drinks  . Types: 7 Cans of beer per week     Social History   Tobacco Use  Smoking Status Never Smoker  Smokeless Tobacco Never Used     Outpatient Encounter Medications as of 01/22/2018  Medication Sig  . Multiple  Vitamin (MULTIVITAMIN) tablet Take 1 tablet by mouth daily.  . sertraline (ZOLOFT) 50 MG tablet Take 1 tablet (50 mg total) by mouth daily.  . SPRINTEC 28 0.25-35 MG-MCG tablet Take 1 tablet by mouth daily.  . [DISCONTINUED] sertraline (ZOLOFT) 50 MG tablet Take 1 tablet (50 mg total) by mouth daily.  Marland Kitchen. azithromycin (ZITHROMAX) 250 MG tablet 2 tabs day one. 1 tab days two-five  . chlorpheniramine-HYDROcodone (TUSSIONEX) 10-8 MG/5ML SUER  Take 5 mLs by mouth every 12 (twelve) hours as needed.   No facility-administered encounter medications on file as of 01/22/2018.     Allergies: Escitalopram oxalate and Bupropion  Body mass index is 32.13 kg/m.  Blood pressure 98/66, pulse 93, temperature 99.7 F (37.6 C), temperature source Oral, height 5' 4.5" (1.638 m), weight 190 lb 1.6 oz (86.2 kg), last menstrual period 12/07/2017, SpO2 97 %.  Review of Systems  Constitutional: Positive for fatigue. Negative for activity change, appetite change, chills, diaphoresis, fever and unexpected weight change.  HENT: Negative for congestion.   Eyes: Negative for visual disturbance.  Respiratory: Negative for cough, chest tightness, shortness of breath, wheezing and stridor.   Cardiovascular: Negative for chest pain, palpitations and leg swelling.  Gastrointestinal: Negative for abdominal distention, abdominal pain, blood in stool, constipation, diarrhea, nausea, rectal pain and vomiting.  Genitourinary: Positive for menstrual problem.  Musculoskeletal: Positive for arthralgias, joint swelling and myalgias.  Neurological: Negative for dizziness and headaches.  Hematological: Does not bruise/bleed easily.  Psychiatric/Behavioral: Positive for agitation, dysphoric mood and sleep disturbance. Negative for behavioral problems, confusion, decreased concentration, hallucinations, self-injury and suicidal ideas. The patient is nervous/anxious. The patient is not hyperactive.        Objective:   Physical Exam Vitals signs and nursing note reviewed.  Constitutional:      General: She is not in acute distress.    Appearance: She is well-developed. She is not diaphoretic.  HENT:     Head: Normocephalic and atraumatic.     Right Ear: External ear normal. No decreased hearing noted. Tympanic membrane is bulging. Tympanic membrane is not erythematous.     Left Ear: External ear normal. No decreased hearing noted. Tympanic membrane is bulging.  Tympanic membrane is not erythematous.     Nose: Mucosal edema and rhinorrhea present.     Right Turbinates: Swollen.     Left Turbinates: Swollen.     Right Sinus: No maxillary sinus tenderness or frontal sinus tenderness.     Left Sinus: No maxillary sinus tenderness or frontal sinus tenderness.     Mouth/Throat:     Pharynx: Oropharyngeal exudate and posterior oropharyngeal erythema present.     Tonsils: No tonsillar exudate or tonsillar abscesses. Swelling: 1+ on the right. 1+ on the left.  Eyes:     Conjunctiva/sclera: Conjunctivae normal.     Pupils: Pupils are equal, round, and reactive to light.  Cardiovascular:     Rate and Rhythm: Normal rate and regular rhythm.     Heart sounds: Normal heart sounds. No murmur.  Pulmonary:     Effort: Pulmonary effort is normal. No respiratory distress.     Breath sounds: Normal breath sounds. No stridor. No wheezing, rhonchi or rales.  Chest:     Chest wall: No tenderness.  Musculoskeletal:     Right knee: She exhibits swelling.  Skin:    General: Skin is warm and dry.     Capillary Refill: Capillary refill takes less than 2 seconds.     Coloration: Skin is  not jaundiced or pale.     Findings: No bruising, erythema, lesion or rash.  Neurological:     Mental Status: She is alert and oriented to person, place, and time.  Psychiatric:        Attention and Perception: She is attentive.        Mood and Affect: Mood is not anxious.        Speech: Speech is rapid and pressured.        Behavior: Behavior normal.        Thought Content: Thought content normal.        Judgment: Judgment normal.     Comments: Well groomed         Assessment & Plan:   1. Bronchitis   2. GAD (generalized anxiety disorder)   3. Depression, recurrent (HCC)   4. Healthcare maintenance     GAD (generalized anxiety disorder) Depression/anxiety- continue with once daily Sertraline 50mg . Continue to drink plenty of water and follow heart healthy  diet.   Depression, recurrent (HCC) Depression/anxiety- continue with once daily Sertraline 50mg . Continue to drink plenty of water and follow heart healthy diet.   Bronchitis [place] Controlled Substance Database reviewed- no aberrancies noted For bronchitis- please take Azithromycin as directed and Tussionex as needed. Increase fluids/rest/vit c-2,000mg /daily when not feeling well. If coughing persists after antibiotic completed, then please call clinic.   Healthcare maintenance Follow-up 3 months for complete physical, fasting labs appt the week prior.   FOLLOW-UP:  Return in about 3 months (around 04/23/2018) for Regular Follow Up, Fasting Labs.

## 2018-01-22 NOTE — Assessment & Plan Note (Signed)
Follow-up 3 months for complete physical, fasting labs appt the week prior.

## 2018-01-22 NOTE — Patient Instructions (Addendum)
Persistent Depressive Disorder  Persistent depressive disorder (PDD) is a mental health condition. PDD causes symptoms of low-level depression for 2 years or longer. It may also be called long-term (chronic) depression or dysthymia. PDD may include episodes of more severe depression that last for about 2 weeks (major depressive disorder or MDD). PDD can affect the way you think, feel, and sleep. This condition may also affect your relationships. You may be more likely to get sick if you have PDD. Symptoms of PDD occur for most of the day and may include:  Feeling tired (fatigue).  Low energy.  Eating too much or too little.  Sleeping too much or too little.  Feeling restless or agitated.  Feeling hopeless.  Feeling worthless or guilty.  Feeling worried or nervous (anxiety).  Trouble concentrating or making decisions.  Low self-esteem.  A negative way of looking at things (outlook).  Not being able to have fun or feel pleasure.  Avoiding interacting with people.  Getting angry or annoyed easily (irritability).  Acting aggressive or angry. Follow these instructions at home: Activity  Go back to your normal activities as told by your doctor.  Exercise regularly as told by your doctor. General instructions  Take over-the-counter and prescription medicines only as told by your doctor.  Do not drink alcohol. Or, limit how much alcohol you drink to no more than 1 drink a day for nonpregnant women and 2 drinks a day for men. One drink equals 12 oz of beer, 5 oz of wine, or 1 oz of hard liquor. Alcohol can affect any antidepressant medicines you are taking. Talk with your doctor about your alcohol use.  Eat a healthy diet and get plenty of sleep.  Find activities that you enjoy each day.  Consider joining a support group. Your doctor may be able to suggest a support group.  Keep all follow-up visits as told by your doctor. This is important. Where to find more  information [employer] on Mental Illness  www.nami.org [place] of Mental Health  http://www.maynard.net/ National Suicide Prevention Lifeline  (440)106-6616).  This is free, 24-hour help. Contact a doctor if:  Your symptoms get worse.  You have new symptoms.  You have trouble sleeping or doing your daily activities. Get help right away if:  You self-harm.  You have serious thoughts about hurting yourself or others.  You see, hear, taste, smell, or feel things that are not there (hallucinate). This information is not intended to replace advice given to you by your health care provider. Make sure you discuss any questions you have with your health care provider. Document Released: 12/15/2014 Document Revised: 08/28/2015 Document Reviewed: 08/28/2015 Elsevier Interactive Patient Education  2019 [employer]vier Inc.   Acute Bronchitis, Adult  Acute bronchitis is sudden (acute) swelling of the air tubes (bronchi) in the lungs. Acute bronchitis causes these tubes to fill with mucus, which can make it hard to breathe. It can also cause coughing or wheezing. In adults, acute bronchitis usually goes away within 2 weeks. A cough caused by bronchitis may last up to 3 weeks. Smoking, allergies, and asthma can make the condition worse. Repeated episodes of bronchitis may cause further lung problems, such as chronic obstructive pulmonary disease (COPD). What are the causes? This condition can be caused by germs and by substances that irritate the lungs, including:  Cold and flu viruses. This condition is most often caused by the same virus that causes a cold.  Bacteria.  Exposure to tobacco smoke, dust,  fumes, and air pollution. What increases the risk? This condition is more likely to develop in people who:  Have close contact with someone with acute bronchitis.  Are exposed to lung irritants, such as tobacco smoke, dust, fumes, and vapors.  Have a weak immune  system.  Have a respiratory condition such as asthma. What are the signs or symptoms? Symptoms of this condition include:  A cough.  Coughing up clear, yellow, or green mucus.  Wheezing.  Chest congestion.  Shortness of breath.  A fever.  Body aches.  Chills.  A sore throat. How is this diagnosed? This condition is usually diagnosed with a physical exam. During the exam, your health care provider may order tests, such as chest X-rays, to rule out other conditions. He or she may also:  Test a sample of your mucus for bacterial infection.  Check the level of oxygen in your blood. This is done to check for pneumonia.  Do a chest X-ray or lung function testing to rule out pneumonia and other conditions.  Perform blood tests. Your health care provider will also ask about your symptoms and medical history. How is this treated? Most cases of acute bronchitis clear up over time without treatment. Your health care provider may recommend:  Drinking more fluids. Drinking more makes your mucus thinner, which may make it easier to breathe.  Taking a medicine for a fever or cough.  Taking an antibiotic medicine.  Using an inhaler to help improve shortness of breath and to control a cough.  Using a cool mist vaporizer or humidifier to make it easier to breathe. Follow these instructions at home: Medicines  Take over-the-counter and prescription medicines only as told by your health care provider.  If you were prescribed an antibiotic, take it as told by your health care provider. Do not stop taking the antibiotic even if you start to feel better. General instructions   Get plenty of rest.  Drink enough fluids to keep your urine pale yellow.  Avoid smoking and secondhand smoke. Exposure to cigarette smoke or irritating chemicals will make bronchitis worse. If you smoke and you need help quitting, ask your health care provider. Quitting smoking will help your lungs heal  faster.  Use an inhaler, cool mist vaporizer, or humidifier as told by your health care provider.  Keep all follow-up visits as told by your health care provider. This is important. How is this prevented? To lower your risk of getting this condition again:  Wash your hands often with soap and water. If soap and water are not available, use hand sanitizer.  Avoid contact with people who have cold symptoms.  Try not to touch your hands to your mouth, nose, or eyes.  Make sure to get the flu shot every year. Contact a health care provider if:  Your symptoms do not improve in 2 weeks of treatment. Get help right away if:  You cough up blood.  You have chest pain.  You have severe shortness of breath.  You become dehydrated.  You faint or keep feeling like you are going to faint.  You keep vomiting.  You have a severe headache.  Your fever or chills gets worse. This information is not intended to replace advice given to you by your health care provider. Make sure you discuss any questions you have with your health care provider. Document Released: 02/11/2004 Document Revised: 08/17/2016 Document Reviewed: 06/24/2015 Elsevier Interactive Patient Education  2019 [employer]  Depression/anxiety- continue with once  daily Sertraline 50mg . Continue to drink plenty of water and follow heart healthy diet. For bronchitis- please take Azithromycin as directed and Tussionex as needed. Increase fluids/rest/vit c-2,000mg /daily when not feeling well. If coughing persists after antibiotic completed, then please call clinic. Follow-up 3 months for complete physical, fasting labs appt the week prior. FEEL BETTER!

## 2018-01-24 ENCOUNTER — Other Ambulatory Visit: Payer: BLUE CROSS/BLUE SHIELD

## 2018-02-01 ENCOUNTER — Other Ambulatory Visit (INDEPENDENT_AMBULATORY_CARE_PROVIDER_SITE_OTHER): Payer: BLUE CROSS/BLUE SHIELD

## 2018-02-01 DIAGNOSIS — N951 Menopausal and female climacteric states: Secondary | ICD-10-CM

## 2018-02-02 LAB — ESTRADIOL: Estradiol: 81 pg/mL

## 2018-02-02 LAB — FOLLICLE STIMULATING HORMONE: FSH: 3.7 m[IU]/mL

## 2018-02-05 ENCOUNTER — Telehealth: Payer: Self-pay

## 2018-02-05 NOTE — Telephone Encounter (Signed)
-----   Message from Patton SallesBrook E Amundson C Silva, MD sent at 02/04/2018 11:17 AM EST ----- Please report hormone results to patient.  She is not menopausal.  I would have her consider Mircette low dose OCP to give her estrogen during her menstrual week.  This means that that she will need to take the reminder pills, as most of them have hormone in them.  Ok to give her refills until her annual exam is due.  She can let me know how they are working for her.

## 2018-02-05 NOTE — Telephone Encounter (Signed)
Left message to call Kaitlyn at [phone]. 

## 2018-02-06 MED ORDER — DESOGESTREL-ETHINYL ESTRADIOL 0.15-0.02/0.01 MG (21/5) PO TABS: 1.0000 | ORAL_TABLET | Freq: Every day | ORAL | 3 refills | Status: DC

## 2018-02-06 NOTE — Telephone Encounter (Signed)
Spoke with patient and results from Dr. Edward Jolly discussed.  Patient voiced clear understanding of results and plan of care outlined per MD.  Instructions given regarding use of new prescription OCP.  Will follow up as scheduled or PRN.   Encounter closed.

## 2018-03-08 ENCOUNTER — Other Ambulatory Visit: Payer: Self-pay | Admitting: Adult Health

## 2018-03-08 DIAGNOSIS — Z1231 Encounter for screening mammogram for malignant neoplasm of breast: Secondary | ICD-10-CM

## 2018-03-27 NOTE — Progress Notes (Signed)
Subjective:    Patient ID: Dawn Lutz, female    DOB: 1975-12-28, 43 y.o.   MRN: [id number]  HPI:  Ms. Weltzin is here for CPE She reports stable mood on Sertraline 50mg  QD, denies SI/HI She reports reduction in hot flashes with new oral birth control Weather permitting- she will walk 3 times/week 7 lb wt gain since last OV 01/22/2018, current wt 197 She continues to abstain from tobacco/vape, has one beer nightly  She needs fasting labs  Healthcare Maintenance: PAP-UTD, last 03/23/2017-normal Mammogram-scheduled 04/09/2018 Immunizations-UTD  Patient Care Team    Relationship Specialty Notifications Start End  Julaine Fusi, NP PCP - General Family Medicine  02/02/17     Patient Active Problem List   Diagnosis Date Noted  . Bronchitis 01/22/2018  . Screening for cervical cancer 03/23/2017  . Breast cancer screening 03/23/2017  . Healthcare maintenance 02/02/2017  . Family history of diabetes mellitus 02/02/2017  . Recurrent pain of right knee 02/02/2017  . Plantar fasciitis, left 02/02/2017  . Arthritis 08/05/2013  . Depression, recurrent (HCC) 08/05/2013  . GAD (generalized anxiety disorder) 08/05/2013  . Psoriasis 08/05/2013     Past Medical History:  Diagnosis Date  . Anxiety   . Depression      History reviewed. No pertinent surgical history.   Family History  Problem Relation Age of Onset  . Depression Mother   . Depression Father   . Alcohol abuse Maternal Grandfather   . Diabetes Paternal Grandfather      Social History   Substance and Sexual Activity  Drug Use No     Social History   Substance and Sexual Activity  Alcohol Use Yes  . Alcohol/week: 7.0 standard drinks  . Types: 7 Cans of beer per week     Social History   Tobacco Use  Smoking Status Never Smoker  Smokeless Tobacco Never Used     Outpatient Encounter Medications as of 04/02/2018  Medication Sig  . desogestrel-ethinyl estradiol (MIRCETTE) 0.15-0.02/0.01 MG  (21/5) tablet Take 1 tablet by mouth daily.  . Multiple Vitamin (MULTIVITAMIN) tablet Take 1 tablet by mouth daily.  . sertraline (ZOLOFT) 50 MG tablet Take 1 tablet (50 mg total) by mouth daily.  . [DISCONTINUED] azithromycin (ZITHROMAX) 250 MG tablet 2 tabs day one. 1 tab days two-five  . [DISCONTINUED] chlorpheniramine-HYDROcodone (TUSSIONEX) 10-8 MG/5ML SUER Take 5 mLs by mouth every 12 (twelve) hours as needed.   No facility-administered encounter medications on file as of 04/02/2018.     Allergies: Escitalopram oxalate and Bupropion  Body mass index is 33.39 kg/m.  Blood pressure 106/72, pulse 74, temperature 98.6 F (37 C), temperature source Oral, height 5' 4.5" (1.638 m), weight 197 lb 9.6 oz (89.6 kg), SpO2 98 %.   Review of Systems  Constitutional: Positive for fatigue. Negative for activity change, appetite change, chills, diaphoresis, fever and unexpected weight change.  HENT: Negative for congestion.   Eyes: Negative for visual disturbance.  Respiratory: Negative for cough, chest tightness, shortness of breath, wheezing and stridor.   Cardiovascular: Negative for chest pain, palpitations and leg swelling.  Gastrointestinal: Negative for abdominal distention, abdominal pain, blood in stool, constipation, diarrhea, nausea and vomiting.  Endocrine: Negative for cold intolerance, heat intolerance, polydipsia, polyphagia and polyuria.  Genitourinary: Negative for difficulty urinating and flank pain.  Musculoskeletal: Negative for arthralgias, back pain, gait problem, joint swelling, myalgias and neck pain.  Skin: Negative for color change, pallor, rash and wound.  Neurological: Negative for dizziness and headaches.  Hematological: Does not bruise/bleed easily.       Objective:   Physical Exam Vitals signs and nursing note reviewed.  Constitutional:      General: She is not in acute distress.    Appearance: She is obese. She is not ill-appearing, toxic-appearing or  diaphoretic.  HENT:     Head: Normocephalic and atraumatic.     Right Ear: Tympanic membrane, ear canal and external ear normal. There is no impacted cerumen.     Left Ear: Tympanic membrane, ear canal and external ear normal. There is no impacted cerumen.     Nose: Nose normal. No congestion or rhinorrhea.     Mouth/Throat:     Mouth: Mucous membranes are moist.     Pharynx: Oropharynx is clear. No oropharyngeal exudate or posterior oropharyngeal erythema.  Eyes:     Extraocular Movements: Extraocular movements intact.     Conjunctiva/sclera: Conjunctivae normal.     Pupils: Pupils are equal, round, and reactive to light.  Neck:     Musculoskeletal: Normal range of motion.  Cardiovascular:     Rate and Rhythm: Normal rate.     Pulses: Normal pulses.     Heart sounds: Normal heart sounds. No murmur. No friction rub. No gallop.   Pulmonary:     Effort: Pulmonary effort is normal. No respiratory distress.     Breath sounds: Normal breath sounds. No stridor. No wheezing, rhonchi or rales.  Chest:     Chest wall: No tenderness.  Abdominal:     General: Abdomen is protuberant. Bowel sounds are normal.     Tenderness: There is no abdominal tenderness. There is no right CVA tenderness, left CVA tenderness, guarding or rebound.  Skin:    General: Skin is warm and dry.     Capillary Refill: Capillary refill takes less than 2 seconds.  Neurological:     Mental Status: She is alert and oriented to person, place, and time.     Coordination: Coordination normal.  Psychiatric:        Mood and Affect: Mood normal.        Behavior: Behavior normal.        Thought Content: Thought content normal.        Judgment: Judgment normal.       Assessment & Plan:   1. Healthcare maintenance   2. Depression, recurrent (HCC)     Healthcare maintenance Please continue all medications as directed. Follow Heart Healthy diet Continue regular exercise.  Recommend at least 30 minutes daily, 5 days  per week of walking, jogging, biking, swimming, YouTube/Pinterest workout videos. Please schedule fasting lab appt in the next few weeks. Recommend follow-up 6 months.  Depression, recurrent (HCC) Mood stable on Sertraline 50mg  QD Denies SI/HI Continue regular exercise     FOLLOW-UP:  Return in about 6 months (around 10/03/2018) for Regular Follow Up, Depression, Anxiety.

## 2018-03-29 ENCOUNTER — Encounter: Payer: BLUE CROSS/BLUE SHIELD | Admitting: Adult Health

## 2018-04-02 ENCOUNTER — Encounter: Payer: Self-pay | Admitting: Adult Health

## 2018-04-02 ENCOUNTER — Other Ambulatory Visit: Payer: Self-pay

## 2018-04-02 ENCOUNTER — Ambulatory Visit (INDEPENDENT_AMBULATORY_CARE_PROVIDER_SITE_OTHER): Payer: BLUE CROSS/BLUE SHIELD | Admitting: Adult Health

## 2018-04-02 VITALS — BP 106/72 | HR 74 | Temp 98.6°F | Ht 64.5 in | Wt 197.6 lb

## 2018-04-02 DIAGNOSIS — Z0000 Encounter for general adult medical examination without abnormal findings: Secondary | ICD-10-CM | POA: Diagnosis not present

## 2018-04-02 DIAGNOSIS — F339 Major depressive disorder, recurrent, unspecified: Secondary | ICD-10-CM | POA: Diagnosis not present

## 2018-04-02 NOTE — Assessment & Plan Note (Signed)
Please continue all medications as directed. Follow Heart Healthy diet Continue regular exercise.  Recommend at least 30 minutes daily, 5 days per week of walking, jogging, biking, swimming, YouTube/Pinterest workout videos. Please schedule fasting lab appt in the next few weeks. Recommend follow-up 6 months.

## 2018-04-02 NOTE — Patient Instructions (Addendum)
Preventive Care for Adults, Female  A healthy lifestyle and preventive care can promote health and wellness. Preventive health guidelines for women include the following key practices.   A routine yearly physical is a good way to check with your health care provider about your health and preventive screening. It is a chance to share any concerns and updates on your health and to receive a thorough exam.   Visit your dentist for a routine exam and preventive care every 6 months. Brush your teeth twice a day and floss once a day. Good oral hygiene prevents tooth decay and gum disease.   The frequency of eye exams is based on your age, health, family medical history, use of contact lenses, and other factors. Follow your health care provider's recommendations for frequency of eye exams.   Eat a healthy diet. Foods like vegetables, fruits, whole grains, low-fat dairy products, and lean protein foods contain the nutrients you need without too many calories. Decrease your intake of foods high in solid fats, added sugars, and salt. Eat the right amount of calories for you.Get information about a proper diet from your health care provider, if necessary.   Regular physical exercise is one of the most important things you can do for your health. Most adults should get at least 150 minutes of moderate-intensity exercise (any activity that increases your heart rate and causes you to sweat) each week. In addition, most adults need muscle-strengthening exercises on 2 or more days a week.   Maintain a healthy weight. The body mass index (BMI) is a screening tool to identify possible weight problems. It provides an estimate of body fat based on height and weight. Your health care provider can find your BMI, and can help you achieve or maintain a healthy weight.For adults 20 years and older:   - A BMI below 18.5 is considered underweight.   - A BMI of 18.5 to 24.9 is normal.   - A BMI of 25 to 29.9 is  considered overweight.   - A BMI of 30 and above is considered obese.   Maintain normal blood lipids and cholesterol levels by exercising and minimizing your intake of trans and saturated fats.  Eat a balanced diet with plenty of fruit and vegetables. Blood tests for lipids and cholesterol should begin at age 20 and be repeated every 5 years minimum.  If your lipid or cholesterol levels are high, you are over 40, or you are at high risk for heart disease, you may need your cholesterol levels checked more frequently.Ongoing high lipid and cholesterol levels should be treated with medicines if diet and exercise are not working.   If you smoke, find out from your health care provider how to quit. If you do not use tobacco, do not start.   Lung cancer screening is recommended for adults aged 55-80 years who are at high risk for developing lung cancer because of a history of smoking. A yearly low-dose CT scan of the lungs is recommended for people who have at least a 30-pack-year history of smoking and are a current smoker or have quit within the past 15 years. A pack year of smoking is smoking an average of 1 pack of cigarettes a day for 1 year (for example: 1 pack a day for 30 years or 2 packs a day for 15 years). Yearly screening should continue until the smoker has stopped smoking for at least 15 years. Yearly screening should be stopped for people who develop a   health problem that would prevent them from having lung cancer treatment.   If you are pregnant, do not drink alcohol. If you are breastfeeding, be very cautious about drinking alcohol. If you are not pregnant and choose to drink alcohol, do not have more than 1 drink per day. One drink is considered to be 12 ounces (355 mL) of beer, 5 ounces (148 mL) of wine, or 1.5 ounces (44 mL) of liquor.   Avoid use of street drugs. Do not share needles with anyone. Ask for help if you need support or instructions about stopping the use of  drugs.   High blood pressure causes heart disease and increases the risk of stroke. Your blood pressure should be checked at least yearly.  Ongoing high blood pressure should be treated with medicines if weight loss and exercise do not work.   If you are 69-55 years old, ask your health care provider if you should take aspirin to prevent strokes.   Diabetes screening involves taking a blood sample to check your fasting blood sugar level. This should be done once every 3 years, after age 38, if you are within normal weight and without risk factors for diabetes. Testing should be considered at a younger age or be carried out more frequently if you are overweight and have at least 1 risk factor for diabetes.   Breast cancer screening is essential preventive care for women. You should practice "breast self-awareness."  This means understanding the normal appearance and feel of your breasts and may include breast self-examination.  Any changes detected, no matter how small, should be reported to a health care provider.  Women in their 80s and 30s should have a clinical breast exam (CBE) by a health care provider as part of a regular health exam every 1 to 3 years.  After age 66, women should have a CBE every year.  Starting at age 1, women should consider having a mammogram (breast X-ray test) every year.  Women who have a family history of breast cancer should talk to their health care provider about genetic screening.  Women at a high risk of breast cancer should talk to their health care providers about having an MRI and a mammogram every year.   -Breast cancer gene (BRCA)-related cancer risk assessment is recommended for women who have family members with BRCA-related cancers. BRCA-related cancers include breast, ovarian, tubal, and peritoneal cancers. Having family members with these cancers may be associated with an increased risk for harmful changes (mutations) in the breast cancer genes BRCA1 and  BRCA2. Results of the assessment will determine the need for genetic counseling and BRCA1 and BRCA2 testing.   The Pap test is a screening test for cervical cancer. A Pap test can show cell changes on the cervix that might become cervical cancer if left untreated. A Pap test is a procedure in which cells are obtained and examined from the lower end of the uterus (cervix).   - Women should have a Pap test starting at age 57.   - Between ages 90 and 70, Pap tests should be repeated every 2 years.   - Beginning at age 63, you should have a Pap test every 3 years as long as the past 3 Pap tests have been normal.   - Some women have medical problems that increase the chance of getting cervical cancer. Talk to your health care provider about these problems. It is especially important to talk to your health care provider if a  new problem develops soon after your last Pap test. In these cases, your health care provider may recommend more frequent screening and Pap tests.   - The above recommendations are the same for women who have or have not gotten the vaccine for human papillomavirus (HPV).   - If you had a hysterectomy for a problem that was not cancer or a condition that could lead to cancer, then you no longer need Pap tests. Even if you no longer need a Pap test, a regular exam is a good idea to make sure no other problems are starting.   - If you are between ages 36 and 66 years, and you have had normal Pap tests going back 10 years, you no longer need Pap tests. Even if you no longer need a Pap test, a regular exam is a good idea to make sure no other problems are starting.   - If you have had past treatment for cervical cancer or a condition that could lead to cancer, you need Pap tests and screening for cancer for at least 20 years after your treatment.   - If Pap tests have been discontinued, risk factors (such as a new sexual partner) need to be reassessed to determine if screening should  be resumed.   - The HPV test is an additional test that may be used for cervical cancer screening. The HPV test looks for the virus that can cause the cell changes on the cervix. The cells collected during the Pap test can be tested for HPV. The HPV test could be used to screen women aged 70 years and older, and should be used in women of any age who have unclear Pap test results. After the age of 67, women should have HPV testing at the same frequency as a Pap test.   Colorectal cancer can be detected and often prevented. Most routine colorectal cancer screening begins at the age of 57 years and continues through age 26 years. However, your health care provider may recommend screening at an earlier age if you have risk factors for colon cancer. On a yearly basis, your health care provider may provide home test kits to check for hidden blood in the stool.  Use of a small camera at the end of a tube, to directly examine the colon (sigmoidoscopy or colonoscopy), can detect the earliest forms of colorectal cancer. Talk to your health care provider about this at age 23, when routine screening begins. Direct exam of the colon should be repeated every 5 -10 years through age 49 years, unless early forms of pre-cancerous polyps or small growths are found.   People who are at an increased risk for hepatitis B should be screened for this virus. You are considered at high risk for hepatitis B if:  -You were born in a country where hepatitis B occurs often. Talk with your health care provider about which countries are considered high risk.  - Your parents were born in a high-risk country and you have not received a shot to protect against hepatitis B (hepatitis B vaccine).  - You have HIV or AIDS.  - You use needles to inject street drugs.  - You live with, or have sex with, someone who has Hepatitis B.  - You get hemodialysis treatment.  - You take certain medicines for conditions like cancer, organ  transplantation, and autoimmune conditions.   Hepatitis C blood testing is recommended for all people born from [birth year] through 1965 and any individual  with known risks for hepatitis C.   Practice safe sex. Use condoms and avoid high-risk sexual practices to reduce the spread of sexually transmitted infections (STIs). STIs include gonorrhea, chlamydia, syphilis, trichomonas, herpes, HPV, and human immunodeficiency virus (HIV). Herpes, HIV, and HPV are viral illnesses that have no cure. They can result in disability, cancer, and death. Sexually active women aged 25 years and younger should be checked for chlamydia. Older women with new or multiple partners should also be tested for chlamydia. Testing for other STIs is recommended if you are sexually active and at increased risk.   Osteoporosis is a disease in which the bones lose minerals and strength with aging. This can result in serious bone fractures or breaks. The risk of osteoporosis can be identified using a bone density scan. Women ages 65 years and over and women at risk for fractures or osteoporosis should discuss screening with their health care providers. Ask your health care provider whether you should take a calcium supplement or vitamin D to There are also several preventive steps women can take to avoid osteoporosis and resulting fractures or to keep osteoporosis from worsening. -->Recommendations include:  Eat a balanced diet high in fruits, vegetables, calcium, and vitamins.  Get enough calcium. The recommended total intake of is 1,200 mg daily; for best absorption, if taking supplements, divide doses into 250-500 mg doses throughout the day. Of the two types of calcium, calcium carbonate is best absorbed when taken with food but calcium citrate can be taken on an empty stomach.  Get enough vitamin D. NAMS and the National Osteoporosis Foundation recommend at least 1,000 IU per day for women age 50 and over who are at risk of vitamin D  deficiency. Vitamin D deficiency can be caused by inadequate sun exposure (for example, those who live in northern latitudes).  Avoid alcohol and smoking. Heavy alcohol intake (more than 7 drinks per week) increases the risk of falls and hip fracture and women smokers tend to lose bone more rapidly and have lower bone mass than nonsmokers. Stopping smoking is one of the most important changes women can make to improve their health and decrease risk for disease.  Be physically active every day. Weight-bearing exercise (for example, fast walking, hiking, jogging, and weight training) may strengthen bones or slow the rate of bone loss that comes with aging. Balancing and muscle-strengthening exercises can reduce the risk of falling and fracture.  Consider therapeutic medications. Currently, several types of effective drugs are available. Healthcare providers can recommend the type most appropriate for each woman.  Eliminate environmental factors that may contribute to accidents. Falls cause nearly 90% of all osteoporotic fractures, so reducing this risk is an important bone-health strategy. Measures include ample lighting, removing obstructions to walking, using nonskid rugs on floors, and placing mats and/or grab bars in showers.  Be aware of medication side effects. Some common medicines make bones weaker. These include a type of steroid drug called glucocorticoids used for arthritis and asthma, some antiseizure drugs, certain sleeping pills, treatments for endometriosis, and some cancer drugs. An overactive thyroid gland or using too much thyroid hormone for an underactive thyroid can also be a problem. If you are taking these medicines, talk to your doctor about what you can do to help protect your bones.reduce the rate of osteoporosis.    Menopause can be associated with physical symptoms and risks. Hormone replacement therapy is available to decrease symptoms and risks. You should talk to your  health care provider   about whether hormone replacement therapy is right for you.   Use sunscreen. Apply sunscreen liberally and repeatedly throughout the day. You should seek shade when your shadow is shorter than you. Protect yourself by wearing long sleeves, pants, a wide-brimmed hat, and sunglasses year round, whenever you are outdoors.   Once a month, do a whole body skin exam, using a mirror to look at the skin on your back. Tell your health care provider of new moles, moles that have irregular borders, moles that are larger than a pencil eraser, or moles that have changed in shape or color.   -Stay current with required vaccines (immunizations).   Influenza vaccine. All adults should be immunized every year.  Tetanus, diphtheria, and acellular pertussis (Td, Tdap) vaccine. Pregnant women should receive 1 dose of Tdap vaccine during each pregnancy. The dose should be obtained regardless of the length of time since the last dose. Immunization is preferred during the 27th 36th week of gestation. An adult who has not previously received Tdap or who does not know her vaccine status should receive 1 dose of Tdap. This initial dose should be followed by tetanus and diphtheria toxoids (Td) booster doses every 10 years. Adults with an unknown or incomplete history of completing a 3-dose immunization series with Td-containing vaccines should begin or complete a primary immunization series including a Tdap dose. Adults should receive a Td booster every 10 years.  Varicella vaccine. An adult without evidence of immunity to varicella should receive 2 doses or a second dose if she has previously received 1 dose. Pregnant females who do not have evidence of immunity should receive the first dose after pregnancy. This first dose should be obtained before leaving the health care facility. The second dose should be obtained 4 8 weeks after the first dose.  Human papillomavirus (HPV) vaccine. Females aged 13 26  years who have not received the vaccine previously should obtain the 3-dose series. The vaccine is not recommended for use in pregnant females. However, pregnancy testing is not needed before receiving a dose. If a female is found to be pregnant after receiving a dose, no treatment is needed. In that case, the remaining doses should be delayed until after the pregnancy. Immunization is recommended for any person with an immunocompromised condition through the age of 26 years if she did not get any or all doses earlier. During the 3-dose series, the second dose should be obtained 4 8 weeks after the first dose. The third dose should be obtained 24 weeks after the first dose and 16 weeks after the second dose.  Zoster vaccine. One dose is recommended for adults aged 60 years or older unless certain conditions are present.  Measles, mumps, and rubella (MMR) vaccine. Adults born before 1957 generally are considered immune to measles and mumps. Adults born in 1957 or later should have 1 or more doses of MMR vaccine unless there is a contraindication to the vaccine or there is laboratory evidence of immunity to each of the three diseases. A routine second dose of MMR vaccine should be obtained at least 28 days after the first dose for students attending postsecondary schools, health care workers, or international travelers. People who received inactivated measles vaccine or an unknown type of measles vaccine during 1963 1967 should receive 2 doses of MMR vaccine. People who received inactivated mumps vaccine or an unknown type of mumps vaccine before 1979 and are at high risk for mumps infection should consider immunization with 2 doses of   MMR vaccine. For females of childbearing age, rubella immunity should be determined. If there is no evidence of immunity, females who are not pregnant should be vaccinated. If there is no evidence of immunity, females who are pregnant should delay immunization until after pregnancy.  Unvaccinated health care workers born before [birth year] who lack laboratory evidence of measles, mumps, or rubella immunity or laboratory confirmation of disease should consider measles and mumps immunization with 2 doses of MMR vaccine or rubella immunization with 1 dose of MMR vaccine.  Pneumococcal 13-valent conjugate (PCV13) vaccine. When indicated, a person who is uncertain of her immunization history and has no record of immunization should receive the PCV13 vaccine. An adult aged 54 years or older who has certain medical conditions and has not been previously immunized should receive 1 dose of PCV13 vaccine. This PCV13 should be followed with a dose of pneumococcal polysaccharide (PPSV23) vaccine. The PPSV23 vaccine dose should be obtained at least 8 weeks after the dose of PCV13 vaccine. An adult aged 58 years or older who has certain medical conditions and previously received 1 or more doses of PPSV23 vaccine should receive 1 dose of PCV13. The PCV13 vaccine dose should be obtained 1 or more years after the last PPSV23 vaccine dose.  Pneumococcal polysaccharide (PPSV23) vaccine. When PCV13 is also indicated, PCV13 should be obtained first. All adults aged 58 years and older should be immunized. An adult younger than age 65 years who has certain medical conditions should be immunized. Any person who resides in a nursing home or long-term care facility should be immunized. An adult smoker should be immunized. People with an immunocompromised condition and certain other conditions should receive both PCV13 and PPSV23 vaccines. People with human immunodeficiency virus (HIV) infection should be immunized as soon as possible after diagnosis. Immunization during chemotherapy or radiation therapy should be avoided. Routine use of PPSV23 vaccine is not recommended for American Indians, [place] Natives, or people younger than 65 years unless there are medical conditions that require PPSV23 vaccine. When indicated,  people who have unknown immunization and have no record of immunization should receive PPSV23 vaccine. One-time revaccination 5 years after the first dose of PPSV23 is recommended for people aged 70 64 years who have chronic kidney failure, nephrotic syndrome, asplenia, or immunocompromised conditions. People who received 1 2 doses of PPSV23 before age 32 years should receive another dose of PPSV23 vaccine at age 96 years or later if at least 5 years have passed since the previous dose. Doses of PPSV23 are not needed for people immunized with PPSV23 at or after age 55 years.  Meningococcal vaccine. Adults with asplenia or persistent complement component deficiencies should receive 2 doses of quadrivalent meningococcal conjugate (MenACWY-D) vaccine. The doses should be obtained at least 2 months apart. Microbiologists working with certain meningococcal bacteria, [employer] recruits, people at risk during an outbreak, and people who travel to or live in countries with a high rate of meningitis should be immunized. A first-year college student up through age 58 years who is living in a residence hall should receive a dose if she did not receive a dose on or after her 16th birthday. Adults who have certain high-risk conditions should receive one or more doses of vaccine.  Hepatitis A vaccine. Adults who wish to be protected from this disease, have certain high-risk conditions, work with hepatitis A-infected animals, work in hepatitis A research labs, or travel to or work in countries with a high rate of hepatitis A should be  immunized. Adults who were previously unvaccinated and who anticipate close contact with an international adoptee during the first 60 days after arrival in the [place] from a country with a high rate of hepatitis A should be immunized.  Hepatitis B vaccine.  Adults who wish to be protected from this disease, have certain high-risk conditions, may be exposed to blood or other infectious  body fluids, are household contacts or sex partners of hepatitis B positive people, are clients or workers in certain care facilities, or travel to or work in countries with a high rate of hepatitis B should be immunized.  Haemophilus influenzae type b (Hib) vaccine. A previously unvaccinated person with asplenia or sickle cell disease or having a scheduled splenectomy should receive 1 dose of Hib vaccine. Regardless of previous immunization, a recipient of a hematopoietic stem cell transplant should receive a 3-dose series 6 12 months after her successful transplant. Hib vaccine is not recommended for adults with HIV infection.  Preventive Services / Frequency Ages 6 to 39years  Blood pressure check.** / Every 1 to 2 years.  Lipid and cholesterol check.** / Every 5 years beginning at age 39.  Clinical breast exam.** / Every 3 years for women in their 61s and 62s.  BRCA-related cancer risk assessment.** / For women who have family members with a BRCA-related cancer (breast, ovarian, tubal, or peritoneal cancers).  Pap test.** / Every 2 years from ages 47 through 85. Every 3 years starting at age 34 through age 12 or 74 with a history of 3 consecutive normal Pap tests.  HPV screening.** / Every 3 years from ages 46 through ages 43 to 54 with a history of 3 consecutive normal Pap tests.  Hepatitis C blood test.** / For any individual with known risks for hepatitis C.  Skin self-exam. / Monthly.  Influenza vaccine. / Every year.  Tetanus, diphtheria, and acellular pertussis (Tdap, Td) vaccine.** / Consult your health care provider. Pregnant women should receive 1 dose of Tdap vaccine during each pregnancy. 1 dose of Td every 10 years.  Varicella vaccine.** / Consult your health care provider. Pregnant females who do not have evidence of immunity should receive the first dose after pregnancy.  HPV vaccine. / 3 doses over 6 months, if 64 and younger. The vaccine is not recommended for use in  pregnant females. However, pregnancy testing is not needed before receiving a dose.  Measles, mumps, rubella (MMR) vaccine.** / You need at least 1 dose of MMR if you were born in 1957 or later. You may also need a 2nd dose. For females of childbearing age, rubella immunity should be determined. If there is no evidence of immunity, females who are not pregnant should be vaccinated. If there is no evidence of immunity, females who are pregnant should delay immunization until after pregnancy.  Pneumococcal 13-valent conjugate (PCV13) vaccine.** / Consult your health care provider.  Pneumococcal polysaccharide (PPSV23) vaccine.** / 1 to 2 doses if you smoke cigarettes or if you have certain conditions.  Meningococcal vaccine.** / 1 dose if you are age 71 to 37 years and a Market researcher living in a residence hall, or have one of several medical conditions, you need to get vaccinated against meningococcal disease. You may also need additional booster doses.  Hepatitis A vaccine.** / Consult your health care provider.  Hepatitis B vaccine.** / Consult your health care provider.  Haemophilus influenzae type b (Hib) vaccine.** / Consult your health care provider.  Ages 55 to 64years  Blood pressure check.** / Every 1 to 2 years.  Lipid and cholesterol check.** / Every 5 years beginning at age 20 years.  Lung cancer screening. / Every year if you are aged 55 80 years and have a 30-pack-year history of smoking and currently smoke or have quit within the past 15 years. Yearly screening is stopped once you have quit smoking for at least 15 years or develop a health problem that would prevent you from having lung cancer treatment.  Clinical breast exam.** / Every year after age 40 years.  BRCA-related cancer risk assessment.** / For women who have family members with a BRCA-related cancer (breast, ovarian, tubal, or peritoneal cancers).  Mammogram.** / Every year beginning at age 40  years and continuing for as long as you are in good health. Consult with your health care provider.  Pap test.** / Every 3 years starting at age 30 years through age 65 or 70 years with a history of 3 consecutive normal Pap tests.  HPV screening.** / Every 3 years from ages 30 years through ages 65 to 70 years with a history of 3 consecutive normal Pap tests.  Fecal occult blood test (FOBT) of stool. / Every year beginning at age 50 years and continuing until age 75 years. You may not need to do this test if you get a colonoscopy every 10 years.  Flexible sigmoidoscopy or colonoscopy.** / Every 5 years for a flexible sigmoidoscopy or every 10 years for a colonoscopy beginning at age 50 years and continuing until age 75 years.  Hepatitis C blood test.** / For all people born from 1945 through 1965 and any individual with known risks for hepatitis C.  Skin self-exam. / Monthly.  Influenza vaccine. / Every year.  Tetanus, diphtheria, and acellular pertussis (Tdap/Td) vaccine.** / Consult your health care provider. Pregnant women should receive 1 dose of Tdap vaccine during each pregnancy. 1 dose of Td every 10 years.  Varicella vaccine.** / Consult your health care provider. Pregnant females who do not have evidence of immunity should receive the first dose after pregnancy.  Zoster vaccine.** / 1 dose for adults aged 60 years or older.  Measles, mumps, rubella (MMR) vaccine.** / You need at least 1 dose of MMR if you were born in 1957 or later. You may also need a 2nd dose. For females of childbearing age, rubella immunity should be determined. If there is no evidence of immunity, females who are not pregnant should be vaccinated. If there is no evidence of immunity, females who are pregnant should delay immunization until after pregnancy.  Pneumococcal 13-valent conjugate (PCV13) vaccine.** / Consult your health care provider.  Pneumococcal polysaccharide (PPSV23) vaccine.** / 1 to 2 doses if  you smoke cigarettes or if you have certain conditions.  Meningococcal vaccine.** / Consult your health care provider.  Hepatitis A vaccine.** / Consult your health care provider.  Hepatitis B vaccine.** / Consult your health care provider.  Haemophilus influenzae type b (Hib) vaccine.** / Consult your health care provider.  Ages 65 years and over  Blood pressure check.** / Every 1 to 2 years.  Lipid and cholesterol check.** / Every 5 years beginning at age 20 years.  Lung cancer screening. / Every year if you are aged 55 80 years and have a 30-pack-year history of smoking and currently smoke or have quit within the past 15 years. Yearly screening is stopped once you have quit smoking for at least 15 years or develop a health problem that   would prevent you from having lung cancer treatment.  Clinical breast exam.** / Every year after age 103 years.  BRCA-related cancer risk assessment.** / For women who have family members with a BRCA-related cancer (breast, ovarian, tubal, or peritoneal cancers).  Mammogram.** / Every year beginning at age 36 years and continuing for as long as you are in good health. Consult with your health care provider.  Pap test.** / Every 3 years starting at age 5 years through age 85 or 10 years with 3 consecutive normal Pap tests. Testing can be stopped between 65 and 70 years with 3 consecutive normal Pap tests and no abnormal Pap or HPV tests in the past 10 years.  HPV screening.** / Every 3 years from ages 93 years through ages 70 or 45 years with a history of 3 consecutive normal Pap tests. Testing can be stopped between 65 and 70 years with 3 consecutive normal Pap tests and no abnormal Pap or HPV tests in the past 10 years.  Fecal occult blood test (FOBT) of stool. / Every year beginning at age 8 years and continuing until age 45 years. You may not need to do this test if you get a colonoscopy every 10 years.  Flexible sigmoidoscopy or colonoscopy.** /  Every 5 years for a flexible sigmoidoscopy or every 10 years for a colonoscopy beginning at age 69 years and continuing until age 68 years.  Hepatitis C blood test.** / For all people born from [birth year] through 1967 and any individual with known risks for hepatitis C.  Osteoporosis screening.** / A one-time screening for women ages 7 years and over and women at risk for fractures or osteoporosis.  Skin self-exam. / Monthly.  Influenza vaccine. / Every year.  Tetanus, diphtheria, and acellular pertussis (Tdap/Td) vaccine.** / 1 dose of Td every 10 years.  Varicella vaccine.** / Consult your health care provider.  Zoster vaccine.** / 1 dose for adults aged 5 years or older.  Pneumococcal 13-valent conjugate (PCV13) vaccine.** / Consult your health care provider.  Pneumococcal polysaccharide (PPSV23) vaccine.** / 1 dose for all adults aged 74 years and older.  Meningococcal vaccine.** / Consult your health care provider.  Hepatitis A vaccine.** / Consult your health care provider.  Hepatitis B vaccine.** / Consult your health care provider.  Haemophilus influenzae type b (Hib) vaccine.** / Consult your health care provider. ** Family history and personal history of risk and conditions may change your health care provider's recommendations. Document Released: 03/01/2001 Document Revised: 10/24/2012  [facility] Patient Information 2014 [place], [place].   EXERCISE AND DIET:  We recommended that you start or continue a regular exercise program for good health. Regular exercise means any activity that makes your heart beat faster and makes you sweat.  We recommend exercising at least 30 minutes per day at least 3 days a week, preferably 5.  We also recommend a diet low in fat and sugar / carbohydrates.  Inactivity, poor dietary choices and obesity can cause diabetes, heart attack, stroke, and kidney damage, among others.     ALCOHOL AND SMOKING:  Women should limit their alcohol intake to no  more than 7 drinks/beers/glasses of wine (combined, not each!) per week. Moderation of alcohol intake to this level decreases your risk of breast cancer and liver damage.  ( And of course, no recreational drugs are part of a healthy lifestyle.)  Also, you should not be smoking at all or even being exposed to second hand smoke. Most people know smoking can  cause cancer, and various heart and lung diseases, but did you know it also contributes to weakening of your bones?  Aging of your skin?  Yellowing of your teeth and nails?   CALCIUM AND VITAMIN D:  Adequate intake of calcium and Vitamin D are recommended.  The recommendations for exact amounts of these supplements seem to change often, but generally speaking 600 mg of calcium (either carbonate or citrate) and 800 units of Vitamin D per day seems prudent. Certain women may benefit from higher intake of Vitamin D.  If you are among these women, your doctor will have told you during your visit.     PAP SMEARS:  Pap smears, to check for cervical cancer or precancers,  have traditionally been done yearly, although recent scientific advances have shown that most women can have pap smears less often.  However, every woman still should have a physical exam from her gynecologist or primary care physician every year. It will include a breast check, inspection of the vulva and vagina to check for abnormal growths or skin changes, a visual exam of the cervix, and then an exam to evaluate the size and shape of the uterus and ovaries.  And after 45 years of age, a rectal exam is indicated to check for rectal cancers. We will also provide age appropriate advice regarding health maintenance, like when you should have certain vaccines, screening for sexually transmitted diseases, bone density testing, colonoscopy, mammograms, etc.    MAMMOGRAMS:  All women over 78 years old should have a yearly mammogram. Many facilities now offer a "3D" mammogram, which may cost  around $50 extra out of pocket. If possible,  we recommend you accept the option to have the 3D mammogram performed.  It both reduces the number of women who will be called back for extra views which then turn out to be normal, and it is better than the routine mammogram at detecting truly abnormal areas.     COLONOSCOPY:  Colonoscopy to screen for colon cancer is recommended for all women at age 68.  We know, you hate the idea of the prep.  We agree, BUT, having colon cancer and not knowing it is worse!!  Colon cancer so often starts as a polyp that can be seen and removed at colonscopy, which can quite literally save your life!  And if your first colonoscopy is normal and you have no family history of colon cancer, most women don't have to have it again for 10 years.  Once every ten years, you can do something that may end up saving your life, right?  We will be happy to help you get it scheduled when you are ready.  Be sure to check your insurance coverage so you understand how much it will cost.  It may be covered as a preventative service at no cost, but you should check your particular policy.    Coronavirus (COVID-19) Are you at risk?  Are you at risk for the Coronavirus (COVID-19)?  To be considered HIGH RISK for Coronavirus (COVID-19), you have to meet the following criteria:  . Traveled to [place], [place], [place], [place] or [place]; or in the [place] to [place], [place], [place], or [place]; and have fever, cough, and shortness of breath within the last 2 weeks of travel OR . Been in close contact with a person diagnosed with COVID-19 within the last 2 weeks and have fever, cough, and shortness of breath . IF YOU DO NOT  MEET THESE CRITERIA, YOU ARE CONSIDERED LOW RISK FOR COVID-19.  What to do if you are HIGH RISK for COVID-19?  Marland Kitchen If you are having a medical emergency, call 911. . Seek medical care right away. Before you go to a doctor's office, urgent care or emergency  department, call ahead and tell them about your recent travel, contact with someone diagnosed with COVID-19, and your symptoms. You should receive instructions from your physician's office regarding next steps of care.  . When you arrive at healthcare provider, tell the healthcare staff immediately you have returned from visiting [place], [place], [place], [place] or [place]; or traveled in the [place] to [place], [place], [place], or [place]; in the last two weeks or you have been in close contact with a person diagnosed with COVID-19 in the last 2 weeks.   . Tell the health care staff about your symptoms: fever, cough and shortness of breath. . After you have been seen by a medical provider, you will be either: o Tested for (COVID-19) and discharged home on quarantine except to seek medical care if symptoms worsen, and asked to  - Stay home and avoid contact with others until you get your results (4-5 days)  - Avoid travel on public transportation if possible (such as bus, train, or airplane) or o Sent to the Emergency Department by EMS for evaluation, COVID-19 testing, and possible admission depending on your condition and test results.  What to do if you are LOW RISK for COVID-19?  Reduce your risk of any infection by using the same precautions used for avoiding the common cold or flu:  Marland Kitchen Wash your hands often with soap and warm water for at least 20 seconds.  If soap and water are not readily available, use an alcohol-based hand sanitizer with at least 60% alcohol.  . If coughing or sneezing, cover your mouth and nose by coughing or sneezing into the elbow areas of your shirt or coat, into a tissue or into your sleeve (not your hands). . Avoid shaking hands with others and consider head nods or verbal greetings only. . Avoid touching your eyes, nose, or mouth with unwashed hands.  . Avoid close contact with people who are sick. . Avoid places or events with large numbers of people  in one location, like concerts or sporting events. . Carefully consider travel plans you have or are making. . If you are planning any travel outside or inside the [place], visit the CDC's Travelers' Health webpage for the latest health notices. . If you have some symptoms but not all symptoms, continue to monitor at home and seek medical attention if your symptoms worsen. . If you are having a medical emergency, call 911.   [place] / e-Visit: [email or web address]         [facility]: [phone]  [facility]: [phone]                   [facility]: [phone]      Please continue all medications as directed. Follow Heart Healthy diet Continue regular exercise.  Recommend at least 30 minutes daily, 5 days per week of walking, jogging, biking, swimming, YouTube/Pinterest workout videos. Please schedule fasting lab appt in the next few weeks. Recommend follow-up 6 months. GREAT TO SEE YOU!

## 2018-04-02 NOTE — Assessment & Plan Note (Signed)
Mood stable on Sertraline 50mg  QD Denies SI/HI Continue regular exercise

## 2018-04-17 ENCOUNTER — Other Ambulatory Visit: Payer: Self-pay

## 2018-04-17 ENCOUNTER — Other Ambulatory Visit (INDEPENDENT_AMBULATORY_CARE_PROVIDER_SITE_OTHER): Payer: BLUE CROSS/BLUE SHIELD

## 2018-04-17 DIAGNOSIS — Z0000 Encounter for general adult medical examination without abnormal findings: Secondary | ICD-10-CM | POA: Diagnosis not present

## 2018-04-18 LAB — HEMOGLOBIN A1C
Est. average glucose Bld gHb Est-mCnc: 100 mg/dL
Hgb A1c MFr Bld: 5.1 % (ref 4.8–5.6)

## 2018-04-18 LAB — COMPREHENSIVE METABOLIC PANEL
ALT: 10 IU/L (ref 0–32)
AST: 12 IU/L (ref 0–40)
Albumin/Globulin Ratio: 1.6 (ref 1.2–2.2)
Albumin: 3.8 g/dL (ref 3.8–4.8)
Alkaline Phosphatase: 73 IU/L (ref 39–117)
BUN/Creatinine Ratio: 14 (ref 9–23)
BUN: 9 mg/dL (ref 6–24)
Bilirubin Total: 0.3 mg/dL (ref 0.0–1.2)
CO2: 23 mmol/L (ref 20–29)
Calcium: 8.9 mg/dL (ref 8.7–10.2)
Chloride: 105 mmol/L (ref 96–106)
Creatinine, Ser: 0.66 mg/dL (ref 0.57–1.00)
GFR calc Af Amer: 123 mL/min/{1.73_m2} (ref >59)
GFR calc non Af Amer: 107 mL/min/{1.73_m2} (ref >59)
Globulin, Total: 2.4 g/dL (ref 1.5–4.5)
Glucose: 89 mg/dL (ref 65–99)
Potassium: 4.4 mmol/L (ref 3.5–5.2)
Sodium: 141 mmol/L (ref 134–144)
Total Protein: 6.2 g/dL (ref 6.0–8.5)

## 2018-04-18 LAB — CBC WITH DIFFERENTIAL/PLATELET
Basophils Absolute: 0 10*3/uL (ref 0.0–0.2)
Basos: 0 %
EOS (ABSOLUTE): 0.2 10*3/uL (ref 0.0–0.4)
Eos: 2 %
Hematocrit: 41.2 % (ref 34.0–46.6)
Hemoglobin: 13.8 g/dL (ref 11.1–15.9)
Immature Grans (Abs): 0 10*3/uL (ref 0.0–0.1)
Immature Granulocytes: 0 %
Lymphocytes Absolute: 2.2 10*3/uL (ref 0.7–3.1)
Lymphs: 25 %
MCH: 31.2 pg (ref 26.6–33.0)
MCHC: 33.5 g/dL (ref 31.5–35.7)
MCV: 93 fL (ref 79–97)
Monocytes Absolute: 0.5 10*3/uL (ref 0.1–0.9)
Monocytes: 6 %
Neutrophils Absolute: 5.9 10*3/uL (ref 1.4–7.0)
Neutrophils: 67 %
Platelets: 297 10*3/uL (ref 150–450)
RBC: 4.42 x10E6/uL (ref 3.77–5.28)
RDW: 12.2 % (ref 11.7–15.4)
WBC: 8.7 10*3/uL (ref 3.4–10.8)

## 2018-04-18 LAB — LIPID PANEL
Chol/HDL Ratio: 2.5 ratio (ref 0.0–4.4)
Cholesterol, Total: 165 mg/dL (ref 100–199)
HDL: 65 mg/dL (ref >39)
LDL Calculated: 68 mg/dL (ref 0–99)
Triglycerides: 161 mg/dL — ABNORMAL HIGH (ref 0–149)
VLDL Cholesterol Cal: 32 mg/dL (ref 5–40)

## 2018-04-18 LAB — TSH: TSH: 3.79 u[IU]/mL (ref 0.450–4.500)

## 2018-05-09 NOTE — Progress Notes (Signed)
Virtual Visit via Telephone Note  I connected with Dawn Lutz on 05/10/18 at  8:15 AM EDT by telephone and verified that I am speaking with the correct person using two identifiers.   I discussed the limitations, risks, security and privacy concerns of performing an evaluation and management service by telephone and the availability of in person appointments. The staff discussed with the patient that there may be a patient responsible charge related to this service. The patient expressed understanding and agreed to proceed.  Location of Patient- Home Location of Provider- In Clinic   History of Present Illness: Dawn Lutz calls in today with new onset breast lump that she detected 3 weeks ago.  She denies acute trauma or tick bite prior to lump developing. Last Mammogram March 2019- normal imaging, records reviewed and in system She reports that the lump is about "the size of a pea and at the 4-5 o clock position" from her right nipple. She reports minor pain with palpation, denies pain at rest. She reports minor redness about diameter of quarter around lump. She denies puckering of skin She denies discharge from R nipple She has never detected breasts lumps previously  She denies first degree family hx of breast ca She denies fever/increase in night sweats/malaise/poor appetite/unexplained weight loss.  Patient Care Team    Relationship Specialty Notifications Start End  Julaine Fusianford, Katy D, NP PCP - General Family Medicine  02/02/17     Patient Active Problem List   Diagnosis Date Noted  . Bronchitis 01/22/2018  . Screening for cervical cancer 03/23/2017  . Breast cancer screening 03/23/2017  . Healthcare maintenance 02/02/2017  . Family history of diabetes mellitus 02/02/2017  . Recurrent pain of right knee 02/02/2017  . Plantar fasciitis, left 02/02/2017  . Arthritis 08/05/2013  . Depression, recurrent (HCC) 08/05/2013  . GAD (generalized anxiety disorder) 08/05/2013  .  Psoriasis 08/05/2013     Past Medical History:  Diagnosis Date  . Anxiety   . Depression      History reviewed. No pertinent surgical history.   Family History  Problem Relation Age of Onset  . Depression Mother   . Depression Father   . Alcohol abuse Maternal Grandfather   . Diabetes Paternal Grandfather      Social History   Substance and Sexual Activity  Drug Use No     Social History   Substance and Sexual Activity  Alcohol Use Yes  . Alcohol/week: 7.0 standard drinks  . Types: 7 Cans of beer per week     Social History   Tobacco Use  Smoking Status Never Smoker  Smokeless Tobacco Never Used     Outpatient Encounter Medications as of 05/10/2018  Medication Sig  . desogestrel-ethinyl estradiol (MIRCETTE) 0.15-0.02/0.01 MG (21/5) tablet Take 1 tablet by mouth daily.  . Multiple Vitamin (MULTIVITAMIN) tablet Take 1 tablet by mouth daily.  . sertraline (ZOLOFT) 50 MG tablet Take 1 tablet (50 mg total) by mouth daily.   No facility-administered encounter medications on file as of 05/10/2018.     Allergies: Escitalopram oxalate and Bupropion  Body mass index is 32.62 kg/m.  Blood pressure 106/70, temperature 98.8 F (37.1 C), temperature source Oral, height 5' 4.5" (1.638 m), weight 193 lb (87.5 kg), last menstrual period 04/19/2018.  Observations/Objective: No acute distress noted during telephone  Assessment and Plan: Diagnotic mammogram ASAP- with COVID-19 pandemic imaging requests have been deferred, with only emergent requests authorized  Follow Up Instructions: Once imaging completed    I  discussed the assessment and treatment plan with the patient. The patient was provided an opportunity to ask questions and all were answered. The patient agreed with the plan and demonstrated an understanding of the instructions.   The patient was advised to call back or seek an in-person evaluation if the symptoms worsen or if the condition fails to  improve as anticipated.  I provided 22 minutes of non-face-to-face time during this encounter.   Julaine Fusi, NP

## 2018-05-10 ENCOUNTER — Other Ambulatory Visit: Payer: Self-pay | Admitting: Adult Health

## 2018-05-10 ENCOUNTER — Encounter: Payer: Self-pay | Admitting: Adult Health

## 2018-05-10 ENCOUNTER — Other Ambulatory Visit: Payer: Self-pay

## 2018-05-10 ENCOUNTER — Ambulatory Visit (INDEPENDENT_AMBULATORY_CARE_PROVIDER_SITE_OTHER): Payer: BLUE CROSS/BLUE SHIELD | Admitting: Adult Health

## 2018-05-10 VITALS — BP 106/70 | Temp 98.8°F | Ht 64.5 in | Wt 193.0 lb

## 2018-05-10 DIAGNOSIS — N6310 Unspecified lump in the right breast, unspecified quadrant: Secondary | ICD-10-CM

## 2018-05-10 NOTE — Assessment & Plan Note (Signed)
Assessment and Plan: Diagnotic mammogram ASAP- with COVID-19 pandemic imaging requests have been deferred, with only emergent requests authorized  Follow Up Instructions: Once imaging completed    I discussed the assessment and treatment plan with the patient. The patient was provided an opportunity to ask questions and all were answered. The patient agreed with the plan and demonstrated an understanding of the instructions.   The patient was advised to call back or seek an in-person evaluation if the symptoms worsen or if the condition fails to improve as anticipated.

## 2018-05-18 ENCOUNTER — Ambulatory Visit
Admission: RE | Admit: 2018-05-18 | Discharge: 2018-05-18 | Disposition: A | Discharge status: Home / Self Care | Payer: BLUE CROSS/BLUE SHIELD | Source: Ambulatory Visit | Attending: Adult Health | Admitting: Adult Health

## 2018-05-18 ENCOUNTER — Other Ambulatory Visit: Payer: Self-pay

## 2018-05-18 DIAGNOSIS — N6314 Unspecified lump in the right breast, lower inner quadrant: Secondary | ICD-10-CM | POA: Diagnosis not present

## 2018-05-18 DIAGNOSIS — N6310 Unspecified lump in the right breast, unspecified quadrant: Secondary | ICD-10-CM

## 2018-10-02 NOTE — Progress Notes (Signed)
Virtual Visit via Telephone Note  I connected with Dawn Lutz on 10/03/18 at  8:45 AM EDT by telephone and verified that I am speaking with the correct person using two identifiers.  Location: Patient: Home Provider: In Clinic    I discussed the limitations, risks, security and privacy concerns of performing an evaluation and management service by telephone and the availability of in person appointments. I also discussed with the patient that there may be a patient responsible charge related to this service. The patient expressed understanding and agreed to proceed.   History of Present Illness:  04/02/2018 OV: Dawn Lutz is here for CPE She reports stable mood on Sertraline 50mg  QD, denies SI/HI She reports reduction in hot flashes with new oral birth control Weather permitting- she will walk 3 times/week 7 lb wt gain since last OV 01/22/2018, current wt 197 She continues to abstain from tobacco/vape, has one beer nightly  She needs fasting labs  Healthcare Maintenance: PAP-UTD, last 03/23/2017-normal Mammogram-scheduled 04/09/2018 Immunizations-UTD  10/03/2018 OV:  Dawn Lutz calls in for regular f/u:GAD, Depression She reports "feeling just great" Mood stable, denies SI/HI She is currently sertraline 50mg  QD She has been hiking several times/week She estimates to drink >75 oz water/day, follows heart healthy diet She denies tobacco/vape use She continues to enjoy one craft beer/day Work and home life are both going well   May 2020 RECOMMENDATION: Clinical evaluation/follow-up for suspected sebaceous cyst right breast. Consider dermatologic consultation.  Screening mammogram in one year.(Code:[id number])  I have discussed the findings and recommendations with the patient. Results were also provided in writing at the conclusion of the visit. If applicable, a reminder letter will be sent to the patient regarding the next appointment.  BI-RADS CATEGORY  2:  Benign.  Patient Care Team    Relationship Specialty Notifications Start End  Julaine Fusianford, Lupe Bonner D, NP PCP - General Family Medicine  02/02/17     Patient Active Problem List   Diagnosis Date Noted  . Breast mass, right 05/10/2018  . Bronchitis 01/22/2018  . Screening for cervical cancer 03/23/2017  . Breast cancer screening 03/23/2017  . Healthcare maintenance 02/02/2017  . Family history of diabetes mellitus 02/02/2017  . Recurrent pain of right knee 02/02/2017  . Plantar fasciitis, left 02/02/2017  . Arthritis 08/05/2013  . Depression, recurrent (HCC) 08/05/2013  . GAD (generalized anxiety disorder) 08/05/2013  . Psoriasis 08/05/2013     Past Medical History:  Diagnosis Date  . Anxiety   . Depression      History reviewed. No pertinent surgical history.   Family History  Problem Relation Age of Onset  . Depression Mother   . Depression Father   . Alcohol abuse Maternal Grandfather   . Diabetes Paternal Grandfather      Social History   Substance and Sexual Activity  Drug Use No     Social History   Substance and Sexual Activity  Alcohol Use Yes  . Alcohol/week: 7.0 standard drinks  . Types: 7 Cans of beer per week     Social History   Tobacco Use  Smoking Status Never Smoker  Smokeless Tobacco Never Used     Outpatient Encounter Medications as of 10/03/2018  Medication Sig  . desogestrel-ethinyl estradiol (MIRCETTE) 0.15-0.02/0.01 MG (21/5) tablet Take 1 tablet by mouth daily.  . Multiple Vitamin (MULTIVITAMIN) tablet Take 1 tablet by mouth daily.  . sertraline (ZOLOFT) 50 MG tablet Take 1 tablet (50 mg total) by mouth daily.   No facility-administered encounter medications  on file as of 10/03/2018.     Allergies: Escitalopram oxalate and Bupropion  Body mass index is 32.79 kg/m.  Blood pressure 108/73, pulse 76, temperature 98.5 F (36.9 C), temperature source Oral, height 5' 4.5" (1.638 m), weight 194 lb (88 kg). Review of  Systems: General:   Denies fever, chills, unexplained weight loss.  Optho/Auditory:   Denies visual changes, blurred vision/LOV Respiratory:   Denies SOB, DOE more than baseline levels.  Cardiovascular:   Denies chest pain, palpitations, new onset peripheral edema  Gastrointestinal:   Denies nausea, vomiting, diarrhea.  Genitourinary: Denies dysuria, freq/ urgency, flank pain or discharge from genitals.  Endocrine:     Denies hot or cold intolerance, polyuria, polydipsia. Musculoskeletal:   Denies unexplained myalgias, joint swelling, unexplained arthralgias, gait problems.  Skin:  Denies rash, suspicious lesions Neurological:     Denies dizziness, unexplained weakness, numbness  Psychiatric/Behavioral:   Denies mood changes, suicidal or homicidal ideations, hallucinations This patient does not have sx concerning for COVID-19 Infection (ie; fever, chills, cough, new or worsening shortness of breath).  Observations/Objective: No acute distress noted during telephone conversation  Assessment and Plan: Continue all medications as directed. Remain well hydrated, follow Mediterranean diet Continue regular hiking Continue to social distance and wear a mask when in public  Follow Up Instructions: 6 months CPE, fasting labs the week prior.   I discussed the assessment and treatment plan with the patient. The patient was provided an opportunity to ask questions and all were answered. The patient agreed with the plan and demonstrated an understanding of the instructions.   The patient was advised to call back or seek an in-person evaluation if the symptoms worsen or if the condition fails to improve as anticipated.  I provided 8 minutes of non-face-to-face time during this encounter.   Esaw Grandchild, NP

## 2018-10-03 ENCOUNTER — Other Ambulatory Visit: Payer: Self-pay

## 2018-10-03 ENCOUNTER — Encounter: Payer: Self-pay | Admitting: Adult Health

## 2018-10-03 ENCOUNTER — Ambulatory Visit (INDEPENDENT_AMBULATORY_CARE_PROVIDER_SITE_OTHER): Payer: BC Managed Care – PPO | Admitting: Adult Health

## 2018-10-03 DIAGNOSIS — F339 Major depressive disorder, recurrent, unspecified: Secondary | ICD-10-CM

## 2018-10-03 NOTE — Assessment & Plan Note (Signed)
Assessment and Plan: Continue all medications as directed. Remain well hydrated, follow Mediterranean diet Continue regular hiking Continue to social distance and wear a mask when in public  Follow Up Instructions: 6 months CPE, fasting labs the week prior.   I discussed the assessment and treatment plan with the patient. The patient was provided an opportunity to ask questions and all were answered. The patient agreed with the plan and demonstrated an understanding of the instructions.   The patient was advised to call back or seek an in-person evaluation if the symptoms worsen or if the condition fails to improve as anticipated.

## 2018-10-30 ENCOUNTER — Other Ambulatory Visit: Payer: Self-pay | Admitting: Adult Health

## 2019-01-29 ENCOUNTER — Ambulatory Visit: Payer: BC Managed Care – PPO | Attending: Internal Medicine

## 2019-01-29 ENCOUNTER — Telehealth: Payer: Self-pay | Admitting: Obstetrics and Gynecology

## 2019-01-29 DIAGNOSIS — Z20822 Contact with and (suspected) exposure to covid-19: Secondary | ICD-10-CM

## 2019-01-29 DIAGNOSIS — N951 Menopausal and female climacteric states: Secondary | ICD-10-CM

## 2019-01-29 NOTE — Telephone Encounter (Signed)
Please contact patient to schedule an annual exam with me.  I will give her a one month refill of her pills.

## 2019-01-29 NOTE — Telephone Encounter (Signed)
Left message for pt to call back to schedule AEX with Dr Edward Jolly with either triage RN or front office.

## 2019-01-29 NOTE — Telephone Encounter (Signed)
Med refill request: [facility] Last AEX: ?? Next AEX: not  scheduled Last MMG (if hormonal med) 05/18/18- 2, benign  Refill authorized: Please Advise?

## 2019-01-30 LAB — NOVEL CORONAVIRUS, NAA: SARS-CoV-2, NAA: NOT DETECTED

## 2019-02-05 NOTE — Telephone Encounter (Signed)
Patient returned call. She is going to her primary care provider for aex.

## 2019-02-06 NOTE — Telephone Encounter (Signed)
Left detailed message per DPR. Pt informed of AEX with Dr Edward Jolly for woman/reproductive health annually.  Pt to return call to triage RN for discussion.

## 2019-02-13 NOTE — Telephone Encounter (Signed)
Patient returned call. She is seeing primary care [facility] for pap smear.

## 2019-02-14 NOTE — Telephone Encounter (Signed)
Left detailed message per DPR. Pt informed of future pap smears and refills from PCP. Pt to call back to office with any questions.   Encounter closed.

## 2019-02-14 NOTE — Telephone Encounter (Signed)
Ok for patient to see her PCP for her annual exams and refills of her pills.

## 2019-02-14 NOTE — Telephone Encounter (Signed)
Dr Edward Jolly, this pt is getting pap smear at PCP. Does she still need appt with you? And if not, then should she have PCP refill Micrette from now on?   Routing to Dr Edward Jolly.

## 2019-02-21 ENCOUNTER — Encounter: Payer: Self-pay | Admitting: Adult Health

## 2019-02-28 ENCOUNTER — Other Ambulatory Visit: Payer: Self-pay | Admitting: Obstetrics and Gynecology

## 2019-02-28 ENCOUNTER — Encounter: Payer: Self-pay | Admitting: Adult Health

## 2019-02-28 DIAGNOSIS — N951 Menopausal and female climacteric states: Secondary | ICD-10-CM

## 2019-03-01 ENCOUNTER — Other Ambulatory Visit: Payer: Self-pay | Admitting: Adult Health

## 2019-03-01 DIAGNOSIS — N951 Menopausal and female climacteric states: Secondary | ICD-10-CM

## 2019-03-01 MED ORDER — DESOGESTREL-ETHINYL ESTRADIOL 0.15-0.02/0.01 MG (21/5) PO TABS: 1.0000 | ORAL_TABLET | Freq: Every day | ORAL | 2 refills | Status: DC

## 2019-03-14 ENCOUNTER — Encounter: Payer: Self-pay | Admitting: Adult Health

## 2019-03-21 ENCOUNTER — Other Ambulatory Visit: Payer: BC Managed Care – PPO

## 2019-03-28 ENCOUNTER — Encounter: Payer: BC Managed Care – PPO | Admitting: Adult Health

## 2019-04-19 ENCOUNTER — Other Ambulatory Visit: Payer: Self-pay | Admitting: Adult Health

## 2019-04-19 DIAGNOSIS — N951 Menopausal and female climacteric states: Secondary | ICD-10-CM

## 2019-04-29 ENCOUNTER — Other Ambulatory Visit: Payer: Self-pay | Admitting: Adult Health

## 2019-04-29 MED ORDER — SERTRALINE HCL 50 MG PO TABS: 50.0000 mg | ORAL_TABLET | Freq: Every day | ORAL | 0 refills | Status: DC

## 2019-04-29 NOTE — Telephone Encounter (Signed)
#  30 Sent to pharmacy per refill protocol. AS, CMA

## 2019-04-29 NOTE — Telephone Encounter (Signed)
Pt called states she knows Rx refill tied to Appt & she scheduled her's for 4/22 /21 but will run out of medication before that date (she request just an emergency supply to get her to Appt date)  --Forwarding request to med asst for :  sertraline (ZOLOFT) 50 MG tablet [[id number]]   Order Details Dose: 50 mg Route: Oral Frequency: Daily  Dispense Quantity: 90 tablet Refills: 1       Sig: TAKE 1 TABLET (50 MG TOTAL) BY MOUTH DAILY.       --send refill order to :  [facility] - [place], [place] - [address]  [address] [place] [address]  Phone:  [phone] Fax:  [phone]   --Fausto Skillern

## 2019-05-09 ENCOUNTER — Other Ambulatory Visit: Payer: Self-pay

## 2019-05-09 ENCOUNTER — Ambulatory Visit (INDEPENDENT_AMBULATORY_CARE_PROVIDER_SITE_OTHER): Payer: BC Managed Care – PPO | Admitting: Family Medicine

## 2019-05-09 ENCOUNTER — Encounter: Payer: Self-pay | Admitting: Family Medicine

## 2019-05-09 VITALS — BP 126/86 | HR 70 | Temp 98.0°F | Ht 65.0 in | Wt 208.0 lb

## 2019-05-09 DIAGNOSIS — N951 Menopausal and female climacteric states: Secondary | ICD-10-CM

## 2019-05-09 DIAGNOSIS — Z719 Counseling, unspecified: Secondary | ICD-10-CM

## 2019-05-09 DIAGNOSIS — F411 Generalized anxiety disorder: Secondary | ICD-10-CM

## 2019-05-09 DIAGNOSIS — F339 Major depressive disorder, recurrent, unspecified: Secondary | ICD-10-CM | POA: Diagnosis not present

## 2019-05-09 DIAGNOSIS — Z0000 Encounter for general adult medical examination without abnormal findings: Secondary | ICD-10-CM

## 2019-05-09 MED ORDER — SERTRALINE HCL 50 MG PO TABS: 50.0000 mg | ORAL_TABLET | Freq: Every day | ORAL | 1 refills | Status: DC

## 2019-05-09 MED ORDER — DESOGESTREL-ETHINYL ESTRADIOL 0.15-0.02/0.01 MG (21/5) PO TABS: 1.0000 | ORAL_TABLET | Freq: Every day | ORAL | 5 refills | Status: DC

## 2019-05-09 NOTE — Patient Instructions (Addendum)
Preventive Care for Adults, Female  A healthy lifestyle and preventive care can promote health and wellness. Preventive health guidelines for women include the following key practices.   A routine yearly physical is a good way to check with your health care provider about your health and preventive screening. It is a chance to share any concerns and updates on your health and to receive a thorough exam.   Visit your dentist for a routine exam and preventive care every 6 months. Brush your teeth twice a day and floss once a day. Good oral hygiene prevents tooth decay and gum disease.   The frequency of eye exams is based on your age, health, family medical history, use of contact lenses, and other factors. Follow your health care provider's recommendations for frequency of eye exams.   Eat a healthy diet. Foods like vegetables, fruits, whole grains, low-fat dairy products, and lean protein foods contain the nutrients you need without too many calories. Decrease your intake of foods high in solid fats, added sugars, and salt. Eat the right amount of calories for you.Get information about a proper diet from your health care provider, if necessary.   Regular physical exercise is one of the most important things you can do for your health. Most adults should get at least 150 minutes of moderate-intensity exercise (any activity that increases your heart rate and causes you to sweat) each week. In addition, most adults need muscle-strengthening exercises on 2 or more days a week.   Maintain a healthy weight. The body mass index (BMI) is a screening tool to identify possible weight problems. It provides an estimate of body fat based on height and weight. Your health care provider can find your BMI, and can help you achieve or maintain a healthy weight.For adults 20 years and older:   - A BMI below 18.5 is considered underweight.   - A BMI of 18.5 to 24.9 is normal.   - A BMI of 25 to 29.9 is  considered overweight.   - A BMI of 30 and above is considered obese.   Maintain normal blood lipids and cholesterol levels by exercising and minimizing your intake of trans and saturated fats.  Eat a balanced diet with plenty of fruit and vegetables. Blood tests for lipids and cholesterol should begin at age 20 and be repeated every 5 years minimum.  If your lipid or cholesterol levels are high, you are over 40, or you are at high risk for heart disease, you may need your cholesterol levels checked more frequently.Ongoing high lipid and cholesterol levels should be treated with medicines if diet and exercise are not working.   If you smoke, find out from your health care provider how to quit. If you do not use tobacco, do not start.   Lung cancer screening is recommended for adults aged 55-80 years who are at high risk for developing lung cancer because of a history of smoking. A yearly low-dose CT scan of the lungs is recommended for people who have at least a 30-pack-year history of smoking and are a current smoker or have quit within the past 15 years. A pack year of smoking is smoking an average of 1 pack of cigarettes a day for 1 year (for example: 1 pack a day for 30 years or 2 packs a day for 15 years). Yearly screening should continue until the smoker has stopped smoking for at least 15 years. Yearly screening should be stopped for people who develop a   health problem that would prevent them from having lung cancer treatment.   If you are pregnant, do not drink alcohol. If you are breastfeeding, be very cautious about drinking alcohol. If you are not pregnant and choose to drink alcohol, do not have more than 1 drink per day. One drink is considered to be 12 ounces (355 mL) of beer, 5 ounces (148 mL) of wine, or 1.5 ounces (44 mL) of liquor.   Avoid use of street drugs. Do not share needles with anyone. Ask for help if you need support or instructions about stopping the use of  drugs.   High blood pressure causes heart disease and increases the risk of stroke. Your blood pressure should be checked at least yearly.  Ongoing high blood pressure should be treated with medicines if weight loss and exercise do not work.   If you are 69-55 years old, ask your health care provider if you should take aspirin to prevent strokes.   Diabetes screening involves taking a blood sample to check your fasting blood sugar level. This should be done once every 3 years, after age 38, if you are within normal weight and without risk factors for diabetes. Testing should be considered at a younger age or be carried out more frequently if you are overweight and have at least 1 risk factor for diabetes.   Breast cancer screening is essential preventive care for women. You should practice "breast self-awareness."  This means understanding the normal appearance and feel of your breasts and may include breast self-examination.  Any changes detected, no matter how small, should be reported to a health care provider.  Women in their 80s and 30s should have a clinical breast exam (CBE) by a health care provider as part of a regular health exam every 1 to 3 years.  After age 66, women should have a CBE every year.  Starting at age 1, women should consider having a mammogram (breast X-ray test) every year.  Women who have a family history of breast cancer should talk to their health care provider about genetic screening.  Women at a high risk of breast cancer should talk to their health care providers about having an MRI and a mammogram every year.   -Breast cancer gene (BRCA)-related cancer risk assessment is recommended for women who have family members with BRCA-related cancers. BRCA-related cancers include breast, ovarian, tubal, and peritoneal cancers. Having family members with these cancers may be associated with an increased risk for harmful changes (mutations) in the breast cancer genes BRCA1 and  BRCA2. Results of the assessment will determine the need for genetic counseling and BRCA1 and BRCA2 testing.   The Pap test is a screening test for cervical cancer. A Pap test can show cell changes on the cervix that might become cervical cancer if left untreated. A Pap test is a procedure in which cells are obtained and examined from the lower end of the uterus (cervix).   - Women should have a Pap test starting at age 57.   - Between ages 90 and 70, Pap tests should be repeated every 2 years.   - Beginning at age 63, you should have a Pap test every 3 years as long as the past 3 Pap tests have been normal.   - Some women have medical problems that increase the chance of getting cervical cancer. Talk to your health care provider about these problems. It is especially important to talk to your health care provider if a  new problem develops soon after your last Pap test. In these cases, your health care provider may recommend more frequent screening and Pap tests.   - The above recommendations are the same for women who have or have not gotten the vaccine for human papillomavirus (HPV).   - If you had a hysterectomy for a problem that was not cancer or a condition that could lead to cancer, then you no longer need Pap tests. Even if you no longer need a Pap test, a regular exam is a good idea to make sure no other problems are starting.   - If you are between ages 36 and 66 years, and you have had normal Pap tests going back 10 years, you no longer need Pap tests. Even if you no longer need a Pap test, a regular exam is a good idea to make sure no other problems are starting.   - If you have had past treatment for cervical cancer or a condition that could lead to cancer, you need Pap tests and screening for cancer for at least 20 years after your treatment.   - If Pap tests have been discontinued, risk factors (such as a new sexual partner) need to be reassessed to determine if screening should  be resumed.   - The HPV test is an additional test that may be used for cervical cancer screening. The HPV test looks for the virus that can cause the cell changes on the cervix. The cells collected during the Pap test can be tested for HPV. The HPV test could be used to screen women aged 70 years and older, and should be used in women of any age who have unclear Pap test results. After the age of 67, women should have HPV testing at the same frequency as a Pap test.   Colorectal cancer can be detected and often prevented. Most routine colorectal cancer screening begins at the age of 57 years and continues through age 26 years. However, your health care provider may recommend screening at an earlier age if you have risk factors for colon cancer. On a yearly basis, your health care provider may provide home test kits to check for hidden blood in the stool.  Use of a small camera at the end of a tube, to directly examine the colon (sigmoidoscopy or colonoscopy), can detect the earliest forms of colorectal cancer. Talk to your health care provider about this at age 23, when routine screening begins. Direct exam of the colon should be repeated every 5 -10 years through age 49 years, unless early forms of pre-cancerous polyps or small growths are found.   People who are at an increased risk for hepatitis B should be screened for this virus. You are considered at high risk for hepatitis B if:  -You were born in a country where hepatitis B occurs often. Talk with your health care provider about which countries are considered high risk.  - Your parents were born in a high-risk country and you have not received a shot to protect against hepatitis B (hepatitis B vaccine).  - You have HIV or AIDS.  - You use needles to inject street drugs.  - You live with, or have sex with, someone who has Hepatitis B.  - You get hemodialysis treatment.  - You take certain medicines for conditions like cancer, organ  transplantation, and autoimmune conditions.   Hepatitis C blood testing is recommended for all people born from [birth year] through 1965 and any individual  with known risks for hepatitis C.   Practice safe sex. Use condoms and avoid high-risk sexual practices to reduce the spread of sexually transmitted infections (STIs). STIs include gonorrhea, chlamydia, syphilis, trichomonas, herpes, HPV, and human immunodeficiency virus (HIV). Herpes, HIV, and HPV are viral illnesses that have no cure. They can result in disability, cancer, and death. Sexually active women aged 25 years and younger should be checked for chlamydia. Older women with new or multiple partners should also be tested for chlamydia. Testing for other STIs is recommended if you are sexually active and at increased risk.   Osteoporosis is a disease in which the bones lose minerals and strength with aging. This can result in serious bone fractures or breaks. The risk of osteoporosis can be identified using a bone density scan. Women ages 65 years and over and women at risk for fractures or osteoporosis should discuss screening with their health care providers. Ask your health care provider whether you should take a calcium supplement or vitamin D to There are also several preventive steps women can take to avoid osteoporosis and resulting fractures or to keep osteoporosis from worsening. -->Recommendations include:  Eat a balanced diet high in fruits, vegetables, calcium, and vitamins.  Get enough calcium. The recommended total intake of is 1,200 mg daily; for best absorption, if taking supplements, divide doses into 250-500 mg doses throughout the day. Of the two types of calcium, calcium carbonate is best absorbed when taken with food but calcium citrate can be taken on an empty stomach.  Get enough vitamin D. NAMS and the National Osteoporosis Foundation recommend at least 1,000 IU per day for women age 50 and over who are at risk of vitamin D  deficiency. Vitamin D deficiency can be caused by inadequate sun exposure (for example, those who live in northern latitudes).  Avoid alcohol and smoking. Heavy alcohol intake (more than 7 drinks per week) increases the risk of falls and hip fracture and women smokers tend to lose bone more rapidly and have lower bone mass than nonsmokers. Stopping smoking is one of the most important changes women can make to improve their health and decrease risk for disease.  Be physically active every day. Weight-bearing exercise (for example, fast walking, hiking, jogging, and weight training) may strengthen bones or slow the rate of bone loss that comes with aging. Balancing and muscle-strengthening exercises can reduce the risk of falling and fracture.  Consider therapeutic medications. Currently, several types of effective drugs are available. Healthcare providers can recommend the type most appropriate for each woman.  Eliminate environmental factors that may contribute to accidents. Falls cause nearly 90% of all osteoporotic fractures, so reducing this risk is an important bone-health strategy. Measures include ample lighting, removing obstructions to walking, using nonskid rugs on floors, and placing mats and/or grab bars in showers.  Be aware of medication side effects. Some common medicines make bones weaker. These include a type of steroid drug called glucocorticoids used for arthritis and asthma, some antiseizure drugs, certain sleeping pills, treatments for endometriosis, and some cancer drugs. An overactive thyroid gland or using too much thyroid hormone for an underactive thyroid can also be a problem. If you are taking these medicines, talk to your doctor about what you can do to help protect your bones.reduce the rate of osteoporosis.    Menopause can be associated with physical symptoms and risks. Hormone replacement therapy is available to decrease symptoms and risks. You should talk to your  health care provider   about whether hormone replacement therapy is right for you.   Use sunscreen. Apply sunscreen liberally and repeatedly throughout the day. You should seek shade when your shadow is shorter than you. Protect yourself by wearing long sleeves, pants, a wide-brimmed hat, and sunglasses year round, whenever you are outdoors.   Once a month, do a whole body skin exam, using a mirror to look at the skin on your back. Tell your health care provider of new moles, moles that have irregular borders, moles that are larger than a pencil eraser, or moles that have changed in shape or color.   -Stay current with required vaccines (immunizations).   Influenza vaccine. All adults should be immunized every year.  Tetanus, diphtheria, and acellular pertussis (Td, Tdap) vaccine. Pregnant women should receive 1 dose of Tdap vaccine during each pregnancy. The dose should be obtained regardless of the length of time since the last dose. Immunization is preferred during the 27th 36th week of gestation. An adult who has not previously received Tdap or who does not know her vaccine status should receive 1 dose of Tdap. This initial dose should be followed by tetanus and diphtheria toxoids (Td) booster doses every 10 years. Adults with an unknown or incomplete history of completing a 3-dose immunization series with Td-containing vaccines should begin or complete a primary immunization series including a Tdap dose. Adults should receive a Td booster every 10 years.  Varicella vaccine. An adult without evidence of immunity to varicella should receive 2 doses or a second dose if she has previously received 1 dose. Pregnant females who do not have evidence of immunity should receive the first dose after pregnancy. This first dose should be obtained before leaving the health care facility. The second dose should be obtained 4 8 weeks after the first dose.  Human papillomavirus (HPV) vaccine. Females aged 13 26  years who have not received the vaccine previously should obtain the 3-dose series. The vaccine is not recommended for use in pregnant females. However, pregnancy testing is not needed before receiving a dose. If a female is found to be pregnant after receiving a dose, no treatment is needed. In that case, the remaining doses should be delayed until after the pregnancy. Immunization is recommended for any person with an immunocompromised condition through the age of 26 years if she did not get any or all doses earlier. During the 3-dose series, the second dose should be obtained 4 8 weeks after the first dose. The third dose should be obtained 24 weeks after the first dose and 16 weeks after the second dose.  Zoster vaccine. One dose is recommended for adults aged 60 years or older unless certain conditions are present.  Measles, mumps, and rubella (MMR) vaccine. Adults born before 1957 generally are considered immune to measles and mumps. Adults born in 1957 or later should have 1 or more doses of MMR vaccine unless there is a contraindication to the vaccine or there is laboratory evidence of immunity to each of the three diseases. A routine second dose of MMR vaccine should be obtained at least 28 days after the first dose for students attending postsecondary schools, health care workers, or international travelers. People who received inactivated measles vaccine or an unknown type of measles vaccine during 1963 1967 should receive 2 doses of MMR vaccine. People who received inactivated mumps vaccine or an unknown type of mumps vaccine before 1979 and are at high risk for mumps infection should consider immunization with 2 doses of   MMR vaccine. For females of childbearing age, rubella immunity should be determined. If there is no evidence of immunity, females who are not pregnant should be vaccinated. If there is no evidence of immunity, females who are pregnant should delay immunization until after pregnancy.  Unvaccinated health care workers born before [birth year] who lack laboratory evidence of measles, mumps, or rubella immunity or laboratory confirmation of disease should consider measles and mumps immunization with 2 doses of MMR vaccine or rubella immunization with 1 dose of MMR vaccine.  Pneumococcal 13-valent conjugate (PCV13) vaccine. When indicated, a person who is uncertain of her immunization history and has no record of immunization should receive the PCV13 vaccine. An adult aged 54 years or older who has certain medical conditions and has not been previously immunized should receive 1 dose of PCV13 vaccine. This PCV13 should be followed with a dose of pneumococcal polysaccharide (PPSV23) vaccine. The PPSV23 vaccine dose should be obtained at least 8 weeks after the dose of PCV13 vaccine. An adult aged 58 years or older who has certain medical conditions and previously received 1 or more doses of PPSV23 vaccine should receive 1 dose of PCV13. The PCV13 vaccine dose should be obtained 1 or more years after the last PPSV23 vaccine dose.  Pneumococcal polysaccharide (PPSV23) vaccine. When PCV13 is also indicated, PCV13 should be obtained first. All adults aged 58 years and older should be immunized. An adult younger than age 65 years who has certain medical conditions should be immunized. Any person who resides in a nursing home or long-term care facility should be immunized. An adult smoker should be immunized. People with an immunocompromised condition and certain other conditions should receive both PCV13 and PPSV23 vaccines. People with human immunodeficiency virus (HIV) infection should be immunized as soon as possible after diagnosis. Immunization during chemotherapy or radiation therapy should be avoided. Routine use of PPSV23 vaccine is not recommended for American Indians, [place] Natives, or people younger than 65 years unless there are medical conditions that require PPSV23 vaccine. When indicated,  people who have unknown immunization and have no record of immunization should receive PPSV23 vaccine. One-time revaccination 5 years after the first dose of PPSV23 is recommended for people aged 70 64 years who have chronic kidney failure, nephrotic syndrome, asplenia, or immunocompromised conditions. People who received 1 2 doses of PPSV23 before age 32 years should receive another dose of PPSV23 vaccine at age 96 years or later if at least 5 years have passed since the previous dose. Doses of PPSV23 are not needed for people immunized with PPSV23 at or after age 55 years.  Meningococcal vaccine. Adults with asplenia or persistent complement component deficiencies should receive 2 doses of quadrivalent meningococcal conjugate (MenACWY-D) vaccine. The doses should be obtained at least 2 months apart. Microbiologists working with certain meningococcal bacteria, [employer] recruits, people at risk during an outbreak, and people who travel to or live in countries with a high rate of meningitis should be immunized. A first-year college student up through age 58 years who is living in a residence hall should receive a dose if she did not receive a dose on or after her 16th birthday. Adults who have certain high-risk conditions should receive one or more doses of vaccine.  Hepatitis A vaccine. Adults who wish to be protected from this disease, have certain high-risk conditions, work with hepatitis A-infected animals, work in hepatitis A research labs, or travel to or work in countries with a high rate of hepatitis A should be  immunized. Adults who were previously unvaccinated and who anticipate close contact with an international adoptee during the first 60 days after arrival in the [place] from a country with a high rate of hepatitis A should be immunized.  Hepatitis B vaccine.  Adults who wish to be protected from this disease, have certain high-risk conditions, may be exposed to blood or other infectious  body fluids, are household contacts or sex partners of hepatitis B positive people, are clients or workers in certain care facilities, or travel to or work in countries with a high rate of hepatitis B should be immunized.  Haemophilus influenzae type b (Hib) vaccine. A previously unvaccinated person with asplenia or sickle cell disease or having a scheduled splenectomy should receive 1 dose of Hib vaccine. Regardless of previous immunization, a recipient of a hematopoietic stem cell transplant should receive a 3-dose series 6 12 months after her successful transplant. Hib vaccine is not recommended for adults with HIV infection.  Preventive Services / Frequency Ages 6 to 39years  Blood pressure check.** / Every 1 to 2 years.  Lipid and cholesterol check.** / Every 5 years beginning at age 39.  Clinical breast exam.** / Every 3 years for women in their 61s and 62s.  BRCA-related cancer risk assessment.** / For women who have family members with a BRCA-related cancer (breast, ovarian, tubal, or peritoneal cancers).  Pap test.** / Every 2 years from ages 47 through 85. Every 3 years starting at age 34 through age 12 or 74 with a history of 3 consecutive normal Pap tests.  HPV screening.** / Every 3 years from ages 46 through ages 43 to 54 with a history of 3 consecutive normal Pap tests.  Hepatitis C blood test.** / For any individual with known risks for hepatitis C.  Skin self-exam. / Monthly.  Influenza vaccine. / Every year.  Tetanus, diphtheria, and acellular pertussis (Tdap, Td) vaccine.** / Consult your health care provider. Pregnant women should receive 1 dose of Tdap vaccine during each pregnancy. 1 dose of Td every 10 years.  Varicella vaccine.** / Consult your health care provider. Pregnant females who do not have evidence of immunity should receive the first dose after pregnancy.  HPV vaccine. / 3 doses over 6 months, if 64 and younger. The vaccine is not recommended for use in  pregnant females. However, pregnancy testing is not needed before receiving a dose.  Measles, mumps, rubella (MMR) vaccine.** / You need at least 1 dose of MMR if you were born in 1957 or later. You may also need a 2nd dose. For females of childbearing age, rubella immunity should be determined. If there is no evidence of immunity, females who are not pregnant should be vaccinated. If there is no evidence of immunity, females who are pregnant should delay immunization until after pregnancy.  Pneumococcal 13-valent conjugate (PCV13) vaccine.** / Consult your health care provider.  Pneumococcal polysaccharide (PPSV23) vaccine.** / 1 to 2 doses if you smoke cigarettes or if you have certain conditions.  Meningococcal vaccine.** / 1 dose if you are age 71 to 37 years and a Market researcher living in a residence hall, or have one of several medical conditions, you need to get vaccinated against meningococcal disease. You may also need additional booster doses.  Hepatitis A vaccine.** / Consult your health care provider.  Hepatitis B vaccine.** / Consult your health care provider.  Haemophilus influenzae type b (Hib) vaccine.** / Consult your health care provider.  Ages 55 to 64years  Blood pressure check.** / Every 1 to 2 years.  Lipid and cholesterol check.** / Every 5 years beginning at age 20 years.  Lung cancer screening. / Every year if you are aged 55 80 years and have a 30-pack-year history of smoking and currently smoke or have quit within the past 15 years. Yearly screening is stopped once you have quit smoking for at least 15 years or develop a health problem that would prevent you from having lung cancer treatment.  Clinical breast exam.** / Every year after age 40 years.  BRCA-related cancer risk assessment.** / For women who have family members with a BRCA-related cancer (breast, ovarian, tubal, or peritoneal cancers).  Mammogram.** / Every year beginning at age 40  years and continuing for as long as you are in good health. Consult with your health care provider.  Pap test.** / Every 3 years starting at age 30 years through age 65 or 70 years with a history of 3 consecutive normal Pap tests.  HPV screening.** / Every 3 years from ages 30 years through ages 65 to 70 years with a history of 3 consecutive normal Pap tests.  Fecal occult blood test (FOBT) of stool. / Every year beginning at age 50 years and continuing until age 75 years. You may not need to do this test if you get a colonoscopy every 10 years.  Flexible sigmoidoscopy or colonoscopy.** / Every 5 years for a flexible sigmoidoscopy or every 10 years for a colonoscopy beginning at age 50 years and continuing until age 75 years.  Hepatitis C blood test.** / For all people born from 1945 through 1965 and any individual with known risks for hepatitis C.  Skin self-exam. / Monthly.  Influenza vaccine. / Every year.  Tetanus, diphtheria, and acellular pertussis (Tdap/Td) vaccine.** / Consult your health care provider. Pregnant women should receive 1 dose of Tdap vaccine during each pregnancy. 1 dose of Td every 10 years.  Varicella vaccine.** / Consult your health care provider. Pregnant females who do not have evidence of immunity should receive the first dose after pregnancy.  Zoster vaccine.** / 1 dose for adults aged 60 years or older.  Measles, mumps, rubella (MMR) vaccine.** / You need at least 1 dose of MMR if you were born in 1957 or later. You may also need a 2nd dose. For females of childbearing age, rubella immunity should be determined. If there is no evidence of immunity, females who are not pregnant should be vaccinated. If there is no evidence of immunity, females who are pregnant should delay immunization until after pregnancy.  Pneumococcal 13-valent conjugate (PCV13) vaccine.** / Consult your health care provider.  Pneumococcal polysaccharide (PPSV23) vaccine.** / 1 to 2 doses if  you smoke cigarettes or if you have certain conditions.  Meningococcal vaccine.** / Consult your health care provider.  Hepatitis A vaccine.** / Consult your health care provider.  Hepatitis B vaccine.** / Consult your health care provider.  Haemophilus influenzae type b (Hib) vaccine.** / Consult your health care provider.  Ages 65 years and over  Blood pressure check.** / Every 1 to 2 years.  Lipid and cholesterol check.** / Every 5 years beginning at age 20 years.  Lung cancer screening. / Every year if you are aged 55 80 years and have a 30-pack-year history of smoking and currently smoke or have quit within the past 15 years. Yearly screening is stopped once you have quit smoking for at least 15 years or develop a health problem that   would prevent you from having lung cancer treatment.  Clinical breast exam.** / Every year after age 103 years.  BRCA-related cancer risk assessment.** / For women who have family members with a BRCA-related cancer (breast, ovarian, tubal, or peritoneal cancers).  Mammogram.** / Every year beginning at age 36 years and continuing for as long as you are in good health. Consult with your health care provider.  Pap test.** / Every 3 years starting at age 5 years through age 85 or 10 years with 3 consecutive normal Pap tests. Testing can be stopped between 65 and 70 years with 3 consecutive normal Pap tests and no abnormal Pap or HPV tests in the past 10 years.  HPV screening.** / Every 3 years from ages 93 years through ages 70 or 45 years with a history of 3 consecutive normal Pap tests. Testing can be stopped between 65 and 70 years with 3 consecutive normal Pap tests and no abnormal Pap or HPV tests in the past 10 years.  Fecal occult blood test (FOBT) of stool. / Every year beginning at age 8 years and continuing until age 45 years. You may not need to do this test if you get a colonoscopy every 10 years.  Flexible sigmoidoscopy or colonoscopy.** /  Every 5 years for a flexible sigmoidoscopy or every 10 years for a colonoscopy beginning at age 69 years and continuing until age 68 years.  Hepatitis C blood test.** / For all people born from [birth year] through 1967 and any individual with known risks for hepatitis C.  Osteoporosis screening.** / A one-time screening for women ages 7 years and over and women at risk for fractures or osteoporosis.  Skin self-exam. / Monthly.  Influenza vaccine. / Every year.  Tetanus, diphtheria, and acellular pertussis (Tdap/Td) vaccine.** / 1 dose of Td every 10 years.  Varicella vaccine.** / Consult your health care provider.  Zoster vaccine.** / 1 dose for adults aged 5 years or older.  Pneumococcal 13-valent conjugate (PCV13) vaccine.** / Consult your health care provider.  Pneumococcal polysaccharide (PPSV23) vaccine.** / 1 dose for all adults aged 74 years and older.  Meningococcal vaccine.** / Consult your health care provider.  Hepatitis A vaccine.** / Consult your health care provider.  Hepatitis B vaccine.** / Consult your health care provider.  Haemophilus influenzae type b (Hib) vaccine.** / Consult your health care provider. ** Family history and personal history of risk and conditions may change your health care provider's recommendations. Document Released: 03/01/2001 Document Revised: 10/24/2012  [facility] Patient Information 2014 [place], [place].   EXERCISE AND DIET:  We recommended that you start or continue a regular exercise program for good health. Regular exercise means any activity that makes your heart beat faster and makes you sweat.  We recommend exercising at least 30 minutes per day at least 3 days a week, preferably 5.  We also recommend a diet low in fat and sugar / carbohydrates.  Inactivity, poor dietary choices and obesity can cause diabetes, heart attack, stroke, and kidney damage, among others.     ALCOHOL AND SMOKING:  Women should limit their alcohol intake to no  more than 7 drinks/beers/glasses of wine (combined, not each!) per week. Moderation of alcohol intake to this level decreases your risk of breast cancer and liver damage.  ( And of course, no recreational drugs are part of a healthy lifestyle.)  Also, you should not be smoking at all or even being exposed to second hand smoke. Most people know smoking can  cause cancer, and various heart and lung diseases, but did you know it also contributes to weakening of your bones?  Aging of your skin?  Yellowing of your teeth and nails?   CALCIUM AND VITAMIN D:  Adequate intake of calcium and Vitamin D are recommended.  The recommendations for exact amounts of these supplements seem to change often, but generally speaking 600 mg of calcium (either carbonate or citrate) and 800 units of Vitamin D per day seems prudent. Certain women may benefit from higher intake of Vitamin D.  If you are among these women, your doctor will have told you during your visit.     PAP SMEARS:  Pap smears, to check for cervical cancer or precancers,  have traditionally been done yearly, although recent scientific advances have shown that most women can have pap smears less often.  However, every woman still should have a physical exam from her gynecologist or primary care physician every year. It will include a breast check, inspection of the vulva and vagina to check for abnormal growths or skin changes, a visual exam of the cervix, and then an exam to evaluate the size and shape of the uterus and ovaries.  And after 46 years of age, a rectal exam is indicated to check for rectal cancers. We will also provide age appropriate advice regarding health maintenance, like when you should have certain vaccines, screening for sexually transmitted diseases, bone density testing, colonoscopy, mammograms, etc.    MAMMOGRAMS:  All women over 40 years old should have a yearly mammogram. Many facilities now offer a "3D" mammogram, which may cost  around $50 extra out of pocket. If possible,  we recommend you accept the option to have the 3D mammogram performed.  It both reduces the number of women who will be called back for extra views which then turn out to be normal, and it is better than the routine mammogram at detecting truly abnormal areas.     COLONOSCOPY:  Colonoscopy to screen for colon cancer is recommended for all women at age 50.  We know, you hate the idea of the prep.  We agree, BUT, having colon cancer and not knowing it is worse!!  Colon cancer so often starts as a polyp that can be seen and removed at colonscopy, which can quite literally save your life!  And if your first colonoscopy is normal and you have no family history of colon cancer, most women don't have to have it again for 10 years.  Once every ten years, you can do something that may end up saving your life, right?  We will be happy to help you get it scheduled when you are ready.  Be sure to check your insurance coverage so you understand how much it will cost.  It may be covered as a preventative service at no cost, but you should check your particular policy.     Please realize, EXERCISE IS MEDICINE!  -  American Heart Association ( AHA) guidelines for exercise : If you are in good health, without any medical conditions, you should engage in 150-300 minutes of moderate intensity aerobic activity per week.  This means you should be huffing and puffing throughout your workout.   Engaging in regular exercise will improve brain function and memory, as well as improve mood, boost immune system and help with weight management.  As well as the other, more well-known effects of exercise such as decreasing blood sugar levels, decreasing blood pressure,  and decreasing   bad cholesterol levels/ increasing good cholesterol levels.     -  The AHA strongly endorses consumption of a diet that contains a variety of foods from all the food categories with an emphasis on fruits and  vegetables; fat-free and low-fat dairy products; cereal and grain products; legumes and nuts; and fish, poultry, and/or extra lean meats.    Excessive food intake, especially of foods high in saturated and trans fats, sugar, and salt, should be avoided.    Adequate water intake of roughly 1/2 of your weight in pounds, should equal the ounces of water per day you should drink.  So for instance, if you're 200 pounds, that would be 100 ounces of water per day.         Mediterranean Diet  Why follow it? Research shows. . Those who follow the Mediterranean diet have a reduced risk of heart disease  . The diet is associated with a reduced incidence of Parkinson's and Alzheimer's diseases . People following the diet may have longer life expectancies and lower rates of chronic diseases  . The Dietary Guidelines for Americans recommends the Mediterranean diet as an eating plan to promote health and prevent disease  What Is the Mediterranean Diet?  . Healthy eating plan based on typical foods and recipes of Mediterranean-style cooking . The diet is primarily a plant based diet; these foods should make up a majority of meals   Starches - Plant based foods should make up a majority of meals - They are an important sources of vitamins, minerals, energy, antioxidants, and fiber - Choose whole grains, foods high in fiber and minimally processed items  - Typical grain sources include wheat, oats, barley, corn, brown rice, bulgar, farro, millet, polenta, couscous  - Various types of beans include chickpeas, lentils, fava beans, black beans, white beans   Fruits  Veggies - Large quantities of antioxidant rich fruits & veggies; 6 or more servings  - Vegetables can be eaten raw or lightly drizzled with oil and cooked  - Vegetables common to the traditional Mediterranean Diet include: artichokes, arugula, beets, broccoli, brussel sprouts, cabbage, carrots, celery, collard greens, cucumbers, eggplant, kale, leeks,  lemons, lettuce, mushrooms, okra, onions, peas, peppers, potatoes, pumpkin, radishes, rutabaga, shallots, spinach, sweet potatoes, turnips, zucchini - Fruits common to the Mediterranean Diet include: apples, apricots, avocados, cherries, clementines, dates, figs, grapefruits, grapes, melons, nectarines, oranges, peaches, pears, pomegranates, strawberries, tangerines  Fats - Replace butter and margarine with healthy oils, such as olive oil, canola oil, and tahini  - Limit nuts to no more than a handful a day  - Nuts include walnuts, almonds, pecans, pistachios, pine nuts  - Limit or avoid candied, honey roasted or heavily salted nuts - Olives are central to the Mediterranean diet - can be eaten whole or used in a variety of dishes   Meats Protein - Limiting red meat: no more than a few times a month - When eating red meat: choose lean cuts and keep the portion to the size of deck of cards - Eggs: approx. 0 to 4 times a week  - Fish and lean poultry: at least 2 a week  - Healthy protein sources include, chicken, turkey, lean beef, lamb - Increase intake of seafood such as tuna, salmon, trout, mackerel, shrimp, scallops - Avoid or limit high fat processed meats such as sausage and bacon  Dairy - Include moderate amounts of low fat dairy products  - Focus on healthy dairy such as fat free yogurt, skim   milk, low or reduced fat cheese - Limit dairy products higher in fat such as whole or 2% milk, cheese, ice cream  Alcohol - Moderate amounts of red wine is ok  - No more than 5 oz daily for women (all ages) and men older than age 65  - No more than 10 oz of wine daily for men younger than 65  Other - Limit sweets and other desserts  - Use herbs and spices instead of salt to flavor foods  - Herbs and spices common to the traditional Mediterranean Diet include: basil, bay leaves, chives, cloves, cumin, fennel, garlic, lavender, marjoram, mint, oregano, parsley, pepper, rosemary, sage, savory, sumac,  tarragon, thyme   It's not just a diet, it's a lifestyle:  . The Mediterranean diet includes lifestyle factors typical of those in the region  . Foods, drinks and meals are best eaten with others and savored . Daily physical activity is important for overall good health . This could be strenuous exercise like running and aerobics . This could also be more leisurely activities such as walking, housework, yard-work, or taking the stairs . Moderation is the key; a balanced and healthy diet accommodates most foods and drinks . Consider portion sizes and frequency of consumption of certain foods   Meal Ideas & Options:  . Breakfast:  o Whole wheat toast or whole wheat English muffins with peanut butter & hard boiled egg o Steel cut oats topped with apples & cinnamon and skim milk  o Fresh fruit: banana, strawberries, melon, berries, peaches  o Smoothies: strawberries, bananas, greek yogurt, peanut butter o Low fat greek yogurt with blueberries and granola  o Egg white omelet with spinach and mushrooms o Breakfast couscous: whole wheat couscous, apricots, skim milk, cranberries  . Sandwiches:  o Hummus and grilled vegetables (peppers, zucchini, squash) on whole wheat bread   o Grilled chicken on whole wheat pita with lettuce, tomatoes, cucumbers or tzatziki  o Tuna salad on whole wheat bread: tuna salad made with greek yogurt, olives, red peppers, capers, green onions o Garlic rosemary lamb pita: lamb sauted with garlic, rosemary, salt & pepper; add lettuce, cucumber, greek yogurt to pita - flavor with lemon juice and black pepper  . Seafood:  o Mediterranean grilled salmon, seasoned with garlic, basil, parsley, lemon juice and black pepper o Shrimp, lemon, and spinach whole-grain pasta salad made with low fat greek yogurt  o Seared scallops with lemon orzo  o Seared tuna steaks seasoned salt, pepper, coriander topped with tomato mixture of olives, tomatoes, olive oil, minced garlic, parsley,  green onions and cappers  . Meats:  o Herbed greek chicken salad with kalamata olives, cucumber, feta  o Red bell peppers stuffed with spinach, bulgur, lean ground beef (or lentils) & topped with feta   o Kebabs: skewers of chicken, tomatoes, onions, zucchini, squash  o Turkey burgers: made with red onions, mint, dill, lemon juice, feta cheese topped with roasted red peppers . Vegetarian o Cucumber salad: cucumbers, artichoke hearts, celery, red onion, feta cheese, tossed in olive oil & lemon juice  o Hummus and whole grain pita points with a greek salad (lettuce, tomato, feta, olives, cucumbers, red onion) o Lentil soup with celery, carrots made with vegetable broth, garlic, salt and pepper  o Tabouli salad: parsley, bulgur, mint, scallions, cucumbers, tomato, radishes, lemon juice, olive oil, salt and pepper.   

## 2019-05-09 NOTE — Progress Notes (Signed)
Female Physical  Impression and Recommendations:    1. Healthcare maintenance   2. Health education/counseling   3. Menopausal symptoms   4. Depression, recurrent ([place])   5. GAD (generalized anxiety disorder)     Of note, this is my first time meeting patient.  Patient is new to me and was previously being cared for at our office by an NP, who no longer works at [facility] [employer].   Will be seen by Lorrene Reid, PA-C in future.   1) Anticipatory Guidance: Discussed skin CA prevention and sunscreen when outside along with skin surveillance; eating a balanced and modest diet; physical activity at least 25 minutes per day or minimum of 150 min/ week moderate to intense activity.  - If desired, patient knows she may request referral to dermatologist or rheumatologist for follow-up.  - Advised patient to engage in prudent self-skin checks at home, practicing the A,B,C,D's of skin surveillance.  - Encouraged patient to engage in self-breast checks at the same time every month.   2) Immunizations / Screenings / Labs:   All immunizations are up-to-date per recommendations or will be updated today if pt allows.    - Patient understands with dental and vision screens they will schedule independently.  - Will obtain CBC, CMP, HgA1c, Lipid panel, TSH and vit D when fasting, if not already done past 12 mo/ recently   - Last pap smear obtained 03/23/2017, WNL. - Discussed need for repeat pap smear every 3-5 years, depending on risk factors.  - Last mammogram done 05/18/2018.  Repeat yearly.  - Continue breast exams yearly.  - Begin colonoscopy at age 88.   3) Preventative Maintenance and Health/Weight Counseling - BMI is 34.61 kg/m: BMI meaning discussed with patient.  Discussed goal to improve diet habits to improve overall feelings of well being and objective health data. Improve nutrient density of diet through increasing intake of fruits and vegetables and decreasing  saturated fats, white flour products and refined sugars.  - Encouraged patient to consume a diet high in protein and fiber and low in processed carbohydrates.  - Advised patient to continue working toward exercising to improve overall mental, physical, and emotional health.    - Reviewed the "spokes of the wheel" of wellbeing.  Stressed the importance of ongoing prudent habits, including regular exercise, appropriate sleep hygiene, healthful dietary habits, and prayer/meditation to relax.  - Encouraged patient to engage in daily physical activity as tolerated, especially a formal exercise routine.  Recommended that the patient eventually strive for at least 150-300 minutes of moderate cardiovascular activity per week according to guidelines established by the [facility].   - Patient should also consume adequate amounts of water.  - Health counseling performed.  All questions answered.    Meds ordered this encounter  Medications  . desogestrel-ethinyl estradiol (MIRCETTE) 0.15-0.02/0.01 MG (21/5) tablet    Sig: Take 1 tablet by mouth daily.    Dispense:  28 tablet    Refill:  5  . sertraline (ZOLOFT) 50 MG tablet    Sig: Take 1 tablet (50 mg total) by mouth daily.    Dispense:  90 tablet    Refill:  1    Orders Placed This Encounter  Procedures  . CBC  . Comprehensive metabolic panel  . TSH  . Hemoglobin A1c  . Lipid panel  . VITAMIN D 25 Hydroxy (Vit-D Deficiency, Fractures)     Return for f/up 6-12 months, yearly for CPE.  Reminded pt important of f-up preventative CPE in 1 year.  Reminded pt again, this is in addition to any chronic care visits.    Gross side effects, risk and benefits, and alternatives of medications discussed with patient.  Patient is aware that all medications have potential side effects and we are unable to predict every side effect or drug-drug interaction that may occur.  Expresses verbal understanding and consents to current therapy plan and treatment  regimen.  F-up preventative CPE in 1 year- reminded pt again, this is in addition to any chronic care visits.    Please see orders placed and AVS handed out to patient at the end of our visit for further patient instructions/ counseling done pertaining to today's office visit.   This case required medical decision making of at least moderate complexity.  This document serves as a record of services personally performed by Thomasene Lot, DO. It was created on her behalf by Peggye Fothergill, a trained medical scribe. The creation of this record is based on the scribe's personal observations and the provider's statements to them.    The above documentation from Peggye Fothergill, medical scribe, has been reviewed by Carlye Grippe, D.O.      Subjective:    I, Peggye Fothergill, am serving as Neurosurgeon for [employer].   CPE HPI: Dawn Lutz is a 44 y.o. female who presents to [facility] at [facility] today a yearly health maintenance exam.   Health Maintenance Summary  - Reviewed and updated, unless pt declines services.  Last Cologuard or Colonoscopy:  Begin at age 61. Family history of Colon CA:  None reported.  Tobacco History Reviewed:  Y; never smoker.  Alcohol and/or drug use:  Has a beer every night; notes "I'm a craft beer person, a beer snob."  No drug use. Exercise Habits:  States that she tries to get up and move during her days.  Typically at lunch she gets out and walks around a little bit. Dental Home:  Has not been going to the dentist recently given the pandemic. Eye exams:  She wears glasses and has an eye doctor; [facility]. Dermatology home:  She does not currently have a dermatologist.  Denies concerns with her skin.  She has psoriasis and the psoriatic arthritis that goes with it, but notes that her insurance stopped paying for the medication, so she's been trying to treat herself over the counter or by herself.  She  occasionally wears sunscreen, "if I'm going to be out a really long time."  Female Health:  PAP Smear - last known results:  WNL last check in 2019; no h/o ABN STD concerns:  None, monogamous with partner. Birth control method:  Takes Mircette desogestrel-ethinyl estradiol oral tablet. Menses regular:  No concerns. Lumps or breast concerns:    None.  Notes she does have a history of breast cyst. Breast Cancer Family History:  No.  Denies family history of cervical, ovarian, or uterine cancer.  Additional concerns beyond health maintenance issues:   She continues Zoloft for depression/anxiety and notes "it's still helping a lot."  Notes that her main concern is her weight.  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   Immunization History  Administered Date(s) Administered  . HPV 9-valent 03/23/2017  . Influenza Inj Mdck Quad Pf 10/25/2017  . Influenza, Quadrivalent, Recombinant, Inj, Pf 10/24/2018  . Influenza-Unspecified 11/17/2017, 12/21/2018  . Tdap 08/06/2013  Health Maintenance  Topic Date Due  . HIV Screening  Never done  . COVID-19 Vaccine (1) Never done  . INFLUENZA VACCINE  08/18/2019  . PAP SMEAR-Modifier  03/23/2020  . TETANUS/TDAP  08/07/2023     Wt Readings from Last 3 Encounters:  05/09/19 208 lb (94.3 kg)  10/03/18 194 lb (88 kg)  05/10/18 193 lb (87.5 kg)   BP Readings from Last 3 Encounters:  05/09/19 126/86  10/03/18 108/73  05/10/18 106/70   Pulse Readings from Last 3 Encounters:  05/09/19 70  10/03/18 76  04/02/18 74     Past Medical History:  Diagnosis Date  . Anxiety   . Depression       History reviewed. No pertinent surgical history.    Family History  Problem Relation Age of Onset  . Depression Mother   . Depression Father   . Alcohol abuse Maternal Grandfather   . Diabetes Paternal Grandfather       Social History   Substance and Sexual Activity  Drug Use No    ,   Social History   Substance and Sexual Activity  Alcohol Use Yes  . Alcohol/week: 7.0 standard drinks  . Types: 7 Cans of beer per week  ,   Social History   Tobacco Use  Smoking Status Never Smoker  Smokeless Tobacco Never Used  ,   Social History   Substance and Sexual Activity  Sexual Activity Yes  . Birth control/protection: Pill    Current Outpatient Medications on File Prior to Visit  Medication Sig Dispense Refill  . Multiple Vitamin (MULTIVITAMIN) tablet Take 1 tablet by mouth daily.     No current facility-administered medications on file prior to visit.    Allergies: Escitalopram oxalate and Bupropion  Review of Systems: General:   Denies fever, chills, unexplained weight loss.  Optho/Auditory:   Denies visual changes, blurred vision/LOV Respiratory:   Denies SOB, DOE more than baseline levels.   Cardiovascular:   Denies chest pain, palpitations, new onset peripheral edema  Gastrointestinal:   Denies nausea, vomiting, diarrhea.  Genitourinary: Denies dysuria, freq/ urgency, flank pain or discharge from genitals.  Endocrine:     Denies hot or cold intolerance, polyuria, polydipsia. Musculoskeletal:   Denies unexplained myalgias, joint swelling, unexplained arthralgias, gait problems.  Skin:  Denies rash, suspicious lesions Neurological:     Denies dizziness, unexplained weakness, numbness  Psychiatric/Behavioral:   Denies mood changes, suicidal or homicidal ideations, hallucinations    Objective:    Blood pressure 126/86, pulse 70, temperature 98 F (36.7 C), temperature source Oral, height 5\' 5"  (1.651 m), weight 208 lb (94.3 kg), SpO2 99 %. Body mass index is 34.61 kg/m. General Appearance:    Alert, cooperative, no distress, appears stated age  Head:    Normocephalic, without obvious abnormality, atraumatic  Eyes:    PERRL, conjunctiva/corneas clear, EOM's intact, fundi    benign, both eyes  Ears:    Normal TM's and external ear canals, both  ears  Nose:   Nares normal, septum midline, mucosa normal, no drainage    or sinus tenderness  Throat:   Lips w/o lesion, mucosa moist, and tongue normal; teeth and   gums normal  Neck:   Supple, symmetrical, trachea midline, no adenopathy;    thyroid:  no enlargement/tenderness/nodules; no carotid   bruit or JVD  Back:     Symmetric, no curvature, ROM normal, no CVA tenderness  Lungs:     Clear to auscultation bilaterally, respirations unlabored, no  Wh/ R/ R  Chest Wall:    No tenderness or gross deformity; normal excursion   Heart:    Regular rate and rhythm, S1 and S2 normal, no murmur, rub   or gallop  Breast Exam:    No tenderness, masses, or nipple abnormality b/l; no d/c  Abdomen:     Soft, non-tender, bowel sounds active all four quadrants, NO   G/R/R, no masses, no organomegaly  Genitalia:    Deferred.  Rectal:    Deferred.  Extremities:   Extremities normal, atraumatic, no cyanosis or gross edema  Pulses:   2+ and symmetric all extremities  Skin:   Warm, dry, Skin color, texture, turgor normal, no obvious rashes or lesions Psych: No HI/SI, judgement and insight good, Euthymic mood. Full Affect.  Neurologic:   CNII-XII intact, normal strength, sensation and reflexes    Throughout

## 2019-05-10 LAB — COMPREHENSIVE METABOLIC PANEL
ALT: 14 IU/L (ref 0–32)
AST: 23 IU/L (ref 0–40)
Albumin/Globulin Ratio: 1.3 (ref 1.2–2.2)
Albumin: 3.7 g/dL — ABNORMAL LOW (ref 3.8–4.8)
Alkaline Phosphatase: 90 IU/L (ref 39–117)
BUN/Creatinine Ratio: 9 (ref 9–23)
BUN: 6 mg/dL (ref 6–24)
Bilirubin Total: 0.3 mg/dL (ref 0.0–1.2)
CO2: 21 mmol/L (ref 20–29)
Calcium: 8.9 mg/dL (ref 8.7–10.2)
Chloride: 105 mmol/L (ref 96–106)
Creatinine, Ser: 0.7 mg/dL (ref 0.57–1.00)
GFR calc Af Amer: 120 mL/min/{1.73_m2} (ref >59)
GFR calc non Af Amer: 104 mL/min/{1.73_m2} (ref >59)
Globulin, Total: 2.9 g/dL (ref 1.5–4.5)
Glucose: 87 mg/dL (ref 65–99)
Potassium: 4.6 mmol/L (ref 3.5–5.2)
Sodium: 140 mmol/L (ref 134–144)
Total Protein: 6.6 g/dL (ref 6.0–8.5)

## 2019-05-10 LAB — HEMOGLOBIN A1C
Est. average glucose Bld gHb Est-mCnc: 97 mg/dL
Hgb A1c MFr Bld: 5 % (ref 4.8–5.6)

## 2019-05-10 LAB — LIPID PANEL
Chol/HDL Ratio: 2.1 ratio (ref 0.0–4.4)
Cholesterol, Total: 172 mg/dL (ref 100–199)
HDL: 82 mg/dL (ref >39)
LDL Chol Calc (NIH): 71 mg/dL (ref 0–99)
Triglycerides: 108 mg/dL (ref 0–149)
VLDL Cholesterol Cal: 19 mg/dL (ref 5–40)

## 2019-05-10 LAB — TSH: TSH: 2.78 u[IU]/mL (ref 0.450–4.500)

## 2019-05-10 LAB — CBC
Hematocrit: 39.4 % (ref 34.0–46.6)
Hemoglobin: 13.9 g/dL (ref 11.1–15.9)
MCH: 32.5 pg (ref 26.6–33.0)
MCHC: 35.3 g/dL (ref 31.5–35.7)
MCV: 92 fL (ref 79–97)
Platelets: 300 10*3/uL (ref 150–450)
RBC: 4.28 x10E6/uL (ref 3.77–5.28)
RDW: 12.3 % (ref 11.7–15.4)
WBC: 9.4 10*3/uL (ref 3.4–10.8)

## 2019-05-10 LAB — VITAMIN D 25 HYDROXY (VIT D DEFICIENCY, FRACTURES): Vit D, 25-Hydroxy: 43.1 ng/mL (ref 30.0–100.0)

## 2019-09-09 ENCOUNTER — Other Ambulatory Visit: Payer: Self-pay | Admitting: Family Medicine

## 2019-09-09 DIAGNOSIS — Z1231 Encounter for screening mammogram for malignant neoplasm of breast: Secondary | ICD-10-CM

## 2019-09-25 ENCOUNTER — Other Ambulatory Visit: Payer: Self-pay

## 2019-09-25 ENCOUNTER — Ambulatory Visit
Admission: RE | Admit: 2019-09-25 | Discharge: 2019-09-25 | Disposition: A | Discharge status: Home / Self Care | Payer: BC Managed Care – PPO | Source: Ambulatory Visit

## 2019-09-25 DIAGNOSIS — Z1231 Encounter for screening mammogram for malignant neoplasm of breast: Secondary | ICD-10-CM | POA: Diagnosis not present

## 2019-10-27 ENCOUNTER — Other Ambulatory Visit: Payer: Self-pay | Admitting: Family Medicine

## 2019-11-01 ENCOUNTER — Encounter: Payer: Self-pay | Admitting: Physician Assistant

## 2019-11-01 MED ORDER — SERTRALINE HCL 50 MG PO TABS: 50.0000 mg | ORAL_TABLET | Freq: Every day | ORAL | 0 refills | Status: DC

## 2019-11-04 DIAGNOSIS — M1711 Unilateral primary osteoarthritis, right knee: Secondary | ICD-10-CM | POA: Diagnosis not present

## 2019-11-05 DIAGNOSIS — H93299 Other abnormal auditory perceptions, unspecified ear: Secondary | ICD-10-CM | POA: Diagnosis not present

## 2019-11-07 ENCOUNTER — Encounter: Payer: Self-pay | Admitting: Physician Assistant

## 2019-11-07 ENCOUNTER — Ambulatory Visit (INDEPENDENT_AMBULATORY_CARE_PROVIDER_SITE_OTHER): Payer: BC Managed Care – PPO | Admitting: Physician Assistant

## 2019-11-07 ENCOUNTER — Other Ambulatory Visit: Payer: Self-pay

## 2019-11-07 VITALS — BP 120/73 | HR 60 | Temp 99.0°F | Ht 65.0 in | Wt 197.5 lb

## 2019-11-07 DIAGNOSIS — E669 Obesity, unspecified: Secondary | ICD-10-CM | POA: Diagnosis not present

## 2019-11-07 DIAGNOSIS — F339 Major depressive disorder, recurrent, unspecified: Secondary | ICD-10-CM | POA: Diagnosis not present

## 2019-11-07 DIAGNOSIS — Z6832 Body mass index (BMI) 32.0-32.9, adult: Secondary | ICD-10-CM | POA: Diagnosis not present

## 2019-11-07 DIAGNOSIS — F411 Generalized anxiety disorder: Secondary | ICD-10-CM | POA: Diagnosis not present

## 2019-11-07 NOTE — Patient Instructions (Signed)

## 2019-11-07 NOTE — Progress Notes (Signed)
Established Patient Office Visit  Subjective:  Patient ID: Dawn Lutz, female    DOB: 05-Feb-1975  Age: 44 y.o. MRN: [id number]  CC:  Chief Complaint  Patient presents with  . Depression  . Anxiety    HPI Joell Buerger presents for mood management. Has no acute concerns today. Reports has been on Zoloft for a long time. Feels fine and mood has been stable with medication. Denies SI/HI. Taking birth control to help with periods but has noticed periods are becoming irregular and likely related to perimenopause.    Past Medical History:  Diagnosis Date  . Anxiety   . Depression     History reviewed. No pertinent surgical history.  Family History  Problem Relation Age of Onset  . Depression Mother   . Depression Father   . Alcohol abuse Maternal Grandfather   . Diabetes Paternal Grandfather     Social History   Socioeconomic History  . Marital status: Single    Spouse name: Not on file  . Number of children: Not on file  . Years of education: Not on file  . Highest education level: Not on file  Occupational History  . Not on file  Tobacco Use  . Smoking status: Never Smoker  . Smokeless tobacco: Never Used  Vaping Use  . Vaping Use: Never used  Substance and Sexual Activity  . Alcohol use: Yes    Alcohol/week: 7.0 standard drinks    Types: 7 Cans of beer per week  . Drug use: No  . Sexual activity: Yes    Birth control/protection: Pill  Other Topics Concern  . Not on file  Social History Narrative  . Not on file   Social Determinants of Health   Financial Resource Strain:   . Difficulty of Paying Living Expenses: Not on file  Food Insecurity:   . Worried About Programme researcher, broadcasting/film/video in the Last Year: Not on file  . Ran Out of Food in the Last Year: Not on file  Transportation Needs:   . Lack of Transportation (Medical): Not on file  . Lack of Transportation (Non-Medical): Not on file  Physical Activity:   . Days of Exercise per Week: Not on  file  . Minutes of Exercise per Session: Not on file  Stress:   . Feeling of Stress : Not on file  Social Connections:   . Frequency of Communication with Friends and Family: Not on file  . Frequency of Social Gatherings with Friends and Family: Not on file  . Attends Religious Services: Not on file  . Active Member of Clubs or Organizations: Not on file  . Attends Banker Meetings: Not on file  . Marital Status: Not on file  Intimate Partner Violence:   . Fear of Current or Ex-Partner: Not on file  . Emotionally Abused: Not on file  . Physically Abused: Not on file  . Sexually Abused: Not on file    Outpatient Medications Prior to Visit  Medication Sig Dispense Refill  . desogestrel-ethinyl estradiol (MIRCETTE) 0.15-0.02/0.01 MG (21/5) tablet Take 1 tablet by mouth daily. 28 tablet 5  . Multiple Vitamin (MULTIVITAMIN) tablet Take 1 tablet by mouth daily.    . sertraline (ZOLOFT) 50 MG tablet Take 1 tablet (50 mg total) by mouth daily. 60 tablet 0   No facility-administered medications prior to visit.    Allergies  Allergen Reactions  . Escitalopram Oxalate     Weight gain/increased depression  . Bupropion Rash  Heart palpitation, shortness of breath    ROS Review of Systems A fourteen system review of systems was performed and found to be positive as per HPI.   Objective:    Physical Exam General:  Well Developed, well nourished, appropriate for stated age.  Neuro:  Alert and oriented,  extra-ocular muscles intact  HEENT:  Normocephalic, atraumatic, neck supple Skin:  no gross rash, warm, pink. Cardiac:  RRR, S1 S2 Respiratory:  ECTA B/L and A/P, Not using accessory muscles, speaking in full sentences- unlabored. Vascular:  Ext warm, no cyanosis apprec.; no gross edema Psych:  No HI/SI, judgement and insight good, Euthymic mood. Full Affect.   BP 120/73   Pulse 60   Temp 99 F (37.2 C) (Oral)   Ht 5\' 5"  (1.651 m)   Wt 197 lb 8 oz (89.6 kg)    LMP 09/18/2019 (Approximate)   SpO2 98% Comment: on RA  BMI 32.87 kg/m  Wt Readings from Last 3 Encounters:  11/07/19 197 lb 8 oz (89.6 kg)  05/09/19 208 lb (94.3 kg)  10/03/18 194 lb (88 kg)     Health Maintenance Due  Topic Date Due  . Hepatitis C Screening  Never done  . COVID-19 Vaccine (1) Never done  . HIV Screening  Never done  . INFLUENZA VACCINE  08/18/2019    There are no preventive care reminders to display for this patient.  Lab Results  Component Value Date   TSH 2.780 05/09/2019   Lab Results  Component Value Date   WBC 9.4 05/09/2019   HGB 13.9 05/09/2019   HCT 39.4 05/09/2019   MCV 92 05/09/2019   PLT 300 05/09/2019   Lab Results  Component Value Date   NA 140 05/09/2019   K 4.6 05/09/2019   CO2 21 05/09/2019   GLUCOSE 87 05/09/2019   BUN 6 05/09/2019   CREATININE 0.70 05/09/2019   BILITOT 0.3 05/09/2019   ALKPHOS 90 05/09/2019   AST 23 05/09/2019   ALT 14 05/09/2019   PROT 6.6 05/09/2019   ALBUMIN 3.7 (L) 05/09/2019   CALCIUM 8.9 05/09/2019   Lab Results  Component Value Date   CHOL 172 05/09/2019   Lab Results  Component Value Date   HDL 82 05/09/2019   Lab Results  Component Value Date   LDLCALC 71 05/09/2019   Lab Results  Component Value Date   TRIG 108 05/09/2019   Lab Results  Component Value Date   CHOLHDL 2.1 05/09/2019   Lab Results  Component Value Date   HGBA1C 5.0 05/09/2019      Assessment & Plan:   Problem List Items Addressed This Visit      Other   Depression, recurrent (HCC) - Primary   GAD (generalized anxiety disorder)    Other Visit Diagnoses    Class 1 obesity with body mass index (BMI) of 32.0 to 32.9 in adult, unspecified obesity type, unspecified whether serious comorbidity present         Recurrent Depression, GAD: -Stable, PHQ-9 score of 1 -Continue current medication regimen. -Will continue to monitor.  Class 1 obesity with body mass index (BMI) of 32.0 to  32.9 in adult,  unspecified obesity type, unspecified whether serious comorbidity present: -Patient has lost 11 pounds since last OV. -Recommend dietary and lifestyle modifications.  No orders of the defined types were placed in this encounter.   Follow-up: Return in about 6 months (around 05/07/2020) for CPE and FBW.   Note:  This note was prepared  with assistance of Systems analyst. Occasional wrong-word or sound-a-like substitutions may have occurred due to the inherent limitations of voice recognition software.  Lorrene Reid, PA-C

## 2019-11-11 ENCOUNTER — Other Ambulatory Visit: Payer: Self-pay

## 2019-11-11 DIAGNOSIS — N951 Menopausal and female climacteric states: Secondary | ICD-10-CM

## 2019-11-11 MED ORDER — DESOGESTREL-ETHINYL ESTRADIOL 0.15-0.02/0.01 MG (21/5) PO TABS: 1.0000 | ORAL_TABLET | Freq: Every day | ORAL | 5 refills | Status: DC

## 2019-12-16 DIAGNOSIS — M25561 Pain in right knee: Secondary | ICD-10-CM | POA: Diagnosis not present

## 2019-12-16 DIAGNOSIS — M2351 Chronic instability of knee, right knee: Secondary | ICD-10-CM | POA: Diagnosis not present

## 2019-12-16 DIAGNOSIS — G8929 Other chronic pain: Secondary | ICD-10-CM | POA: Diagnosis not present

## 2019-12-18 ENCOUNTER — Other Ambulatory Visit: Payer: Self-pay | Admitting: Orthopedic Surgery

## 2019-12-18 DIAGNOSIS — M25561 Pain in right knee: Secondary | ICD-10-CM

## 2020-01-07 ENCOUNTER — Other Ambulatory Visit: Payer: Self-pay

## 2020-01-07 ENCOUNTER — Ambulatory Visit
Admission: RE | Admit: 2020-01-07 | Discharge: 2020-01-07 | Disposition: A | Discharge status: Home / Self Care | Payer: BC Managed Care – PPO | Source: Ambulatory Visit | Attending: Orthopedic Surgery | Admitting: Orthopedic Surgery

## 2020-01-07 DIAGNOSIS — M25561 Pain in right knee: Secondary | ICD-10-CM | POA: Diagnosis not present

## 2020-01-14 DIAGNOSIS — M2351 Chronic instability of knee, right knee: Secondary | ICD-10-CM | POA: Insufficient documentation

## 2020-01-14 DIAGNOSIS — M1711 Unilateral primary osteoarthritis, right knee: Secondary | ICD-10-CM | POA: Diagnosis not present

## 2020-01-14 DIAGNOSIS — M23321 Other meniscus derangements, posterior horn of medial meniscus, right knee: Secondary | ICD-10-CM | POA: Diagnosis not present

## 2020-01-19 ENCOUNTER — Other Ambulatory Visit: Payer: Self-pay | Admitting: Physician Assistant

## 2020-01-29 DIAGNOSIS — Y999 Unspecified external cause status: Secondary | ICD-10-CM | POA: Diagnosis not present

## 2020-01-29 DIAGNOSIS — S83231A Complex tear of medial meniscus, current injury, right knee, initial encounter: Secondary | ICD-10-CM | POA: Diagnosis not present

## 2020-01-29 DIAGNOSIS — M94261 Chondromalacia, right knee: Secondary | ICD-10-CM | POA: Diagnosis not present

## 2020-01-29 DIAGNOSIS — G8918 Other acute postprocedural pain: Secondary | ICD-10-CM | POA: Diagnosis not present

## 2020-01-29 DIAGNOSIS — M65861 Other synovitis and tenosynovitis, right lower leg: Secondary | ICD-10-CM | POA: Diagnosis not present

## 2020-01-29 DIAGNOSIS — M2241 Chondromalacia patellae, right knee: Secondary | ICD-10-CM | POA: Diagnosis not present

## 2020-01-29 DIAGNOSIS — X58XXXA Exposure to other specified factors, initial encounter: Secondary | ICD-10-CM | POA: Diagnosis not present

## 2020-01-29 DIAGNOSIS — M1711 Unilateral primary osteoarthritis, right knee: Secondary | ICD-10-CM | POA: Diagnosis not present

## 2020-02-04 DIAGNOSIS — Z9889 Other specified postprocedural states: Secondary | ICD-10-CM | POA: Insufficient documentation

## 2020-02-06 DIAGNOSIS — R262 Difficulty in walking, not elsewhere classified: Secondary | ICD-10-CM | POA: Diagnosis not present

## 2020-02-06 DIAGNOSIS — M25561 Pain in right knee: Secondary | ICD-10-CM | POA: Diagnosis not present

## 2020-02-06 DIAGNOSIS — M25661 Stiffness of right knee, not elsewhere classified: Secondary | ICD-10-CM | POA: Diagnosis not present

## 2020-02-06 DIAGNOSIS — M6281 Muscle weakness (generalized): Secondary | ICD-10-CM | POA: Diagnosis not present

## 2020-02-11 DIAGNOSIS — M6281 Muscle weakness (generalized): Secondary | ICD-10-CM | POA: Diagnosis not present

## 2020-02-11 DIAGNOSIS — M25561 Pain in right knee: Secondary | ICD-10-CM | POA: Diagnosis not present

## 2020-02-11 DIAGNOSIS — R262 Difficulty in walking, not elsewhere classified: Secondary | ICD-10-CM | POA: Diagnosis not present

## 2020-02-11 DIAGNOSIS — M25661 Stiffness of right knee, not elsewhere classified: Secondary | ICD-10-CM | POA: Diagnosis not present

## 2020-02-13 DIAGNOSIS — M6281 Muscle weakness (generalized): Secondary | ICD-10-CM | POA: Diagnosis not present

## 2020-02-13 DIAGNOSIS — M25661 Stiffness of right knee, not elsewhere classified: Secondary | ICD-10-CM | POA: Diagnosis not present

## 2020-02-13 DIAGNOSIS — M25561 Pain in right knee: Secondary | ICD-10-CM | POA: Diagnosis not present

## 2020-02-13 DIAGNOSIS — R262 Difficulty in walking, not elsewhere classified: Secondary | ICD-10-CM | POA: Diagnosis not present

## 2020-02-14 DIAGNOSIS — R262 Difficulty in walking, not elsewhere classified: Secondary | ICD-10-CM | POA: Diagnosis not present

## 2020-02-14 DIAGNOSIS — M6281 Muscle weakness (generalized): Secondary | ICD-10-CM | POA: Diagnosis not present

## 2020-02-14 DIAGNOSIS — M25561 Pain in right knee: Secondary | ICD-10-CM | POA: Diagnosis not present

## 2020-02-14 DIAGNOSIS — M25661 Stiffness of right knee, not elsewhere classified: Secondary | ICD-10-CM | POA: Diagnosis not present

## 2020-02-17 DIAGNOSIS — M6281 Muscle weakness (generalized): Secondary | ICD-10-CM | POA: Diagnosis not present

## 2020-02-17 DIAGNOSIS — M25561 Pain in right knee: Secondary | ICD-10-CM | POA: Diagnosis not present

## 2020-02-17 DIAGNOSIS — M25661 Stiffness of right knee, not elsewhere classified: Secondary | ICD-10-CM | POA: Diagnosis not present

## 2020-02-17 DIAGNOSIS — R262 Difficulty in walking, not elsewhere classified: Secondary | ICD-10-CM | POA: Diagnosis not present

## 2020-02-20 DIAGNOSIS — R262 Difficulty in walking, not elsewhere classified: Secondary | ICD-10-CM | POA: Diagnosis not present

## 2020-02-20 DIAGNOSIS — M25561 Pain in right knee: Secondary | ICD-10-CM | POA: Diagnosis not present

## 2020-02-20 DIAGNOSIS — M6281 Muscle weakness (generalized): Secondary | ICD-10-CM | POA: Diagnosis not present

## 2020-02-20 DIAGNOSIS — M25661 Stiffness of right knee, not elsewhere classified: Secondary | ICD-10-CM | POA: Diagnosis not present

## 2020-02-25 DIAGNOSIS — M25561 Pain in right knee: Secondary | ICD-10-CM | POA: Diagnosis not present

## 2020-02-25 DIAGNOSIS — R262 Difficulty in walking, not elsewhere classified: Secondary | ICD-10-CM | POA: Diagnosis not present

## 2020-02-25 DIAGNOSIS — M25661 Stiffness of right knee, not elsewhere classified: Secondary | ICD-10-CM | POA: Diagnosis not present

## 2020-02-25 DIAGNOSIS — M6281 Muscle weakness (generalized): Secondary | ICD-10-CM | POA: Diagnosis not present

## 2020-02-27 DIAGNOSIS — R262 Difficulty in walking, not elsewhere classified: Secondary | ICD-10-CM | POA: Diagnosis not present

## 2020-02-27 DIAGNOSIS — M25661 Stiffness of right knee, not elsewhere classified: Secondary | ICD-10-CM | POA: Diagnosis not present

## 2020-02-27 DIAGNOSIS — M6281 Muscle weakness (generalized): Secondary | ICD-10-CM | POA: Diagnosis not present

## 2020-02-27 DIAGNOSIS — M25561 Pain in right knee: Secondary | ICD-10-CM | POA: Diagnosis not present

## 2020-03-03 DIAGNOSIS — M25561 Pain in right knee: Secondary | ICD-10-CM | POA: Diagnosis not present

## 2020-03-03 DIAGNOSIS — M6281 Muscle weakness (generalized): Secondary | ICD-10-CM | POA: Diagnosis not present

## 2020-03-03 DIAGNOSIS — M25661 Stiffness of right knee, not elsewhere classified: Secondary | ICD-10-CM | POA: Diagnosis not present

## 2020-03-03 DIAGNOSIS — R262 Difficulty in walking, not elsewhere classified: Secondary | ICD-10-CM | POA: Diagnosis not present

## 2020-03-18 ENCOUNTER — Other Ambulatory Visit: Payer: Self-pay | Admitting: Physician Assistant

## 2020-04-13 ENCOUNTER — Telehealth: Payer: Self-pay | Admitting: Physician Assistant

## 2020-04-13 DIAGNOSIS — N951 Menopausal and female climacteric states: Secondary | ICD-10-CM

## 2020-04-13 MED ORDER — DESOGESTREL-ETHINYL ESTRADIOL 0.15-0.02/0.01 MG (21/5) PO TABS: 1.0000 | ORAL_TABLET | Freq: Every day | ORAL | 1 refills | Status: DC

## 2020-04-13 NOTE — Telephone Encounter (Signed)
Left voicemail for patient

## 2020-04-13 NOTE — Telephone Encounter (Signed)
Please contact patient to schedule apt per last AVS. AS, CMA 

## 2020-04-21 ENCOUNTER — Other Ambulatory Visit: Payer: Self-pay | Admitting: Physician Assistant

## 2020-04-21 MED ORDER — SERTRALINE HCL 50 MG PO TABS: 1.0000 | ORAL_TABLET | Freq: Every day | ORAL | 0 refills | Status: DC

## 2020-04-25 ENCOUNTER — Other Ambulatory Visit: Payer: Self-pay | Admitting: Physician Assistant

## 2020-04-25 DIAGNOSIS — N951 Menopausal and female climacteric states: Secondary | ICD-10-CM

## 2020-05-07 ENCOUNTER — Other Ambulatory Visit: Payer: Self-pay

## 2020-05-07 ENCOUNTER — Encounter: Payer: Self-pay | Admitting: Physician Assistant

## 2020-05-07 ENCOUNTER — Ambulatory Visit (INDEPENDENT_AMBULATORY_CARE_PROVIDER_SITE_OTHER): Payer: BC Managed Care – PPO | Admitting: Physician Assistant

## 2020-05-07 ENCOUNTER — Other Ambulatory Visit (HOSPITAL_COMMUNITY)
Admission: RE | Admit: 2020-05-07 | Discharge: 2020-05-07 | Disposition: A | Discharge status: Home / Self Care | Payer: BC Managed Care – PPO | Source: Ambulatory Visit | Attending: Physician Assistant | Admitting: Physician Assistant

## 2020-05-07 VITALS — BP 114/75 | HR 77 | Temp 98.5°F | Ht 65.0 in | Wt 204.3 lb

## 2020-05-07 DIAGNOSIS — Z0000 Encounter for general adult medical examination without abnormal findings: Secondary | ICD-10-CM | POA: Diagnosis not present

## 2020-05-07 DIAGNOSIS — Z1211 Encounter for screening for malignant neoplasm of colon: Secondary | ICD-10-CM | POA: Diagnosis not present

## 2020-05-07 DIAGNOSIS — Z01419 Encounter for gynecological examination (general) (routine) without abnormal findings: Secondary | ICD-10-CM | POA: Diagnosis not present

## 2020-05-07 NOTE — Patient Instructions (Signed)
Preventive Care 84-47 Years Old, Female Preventive care refers to lifestyle choices and visits with your health care provider that can promote health and wellness. This includes:  A yearly physical exam. This is also called an annual wellness visit.  Regular dental and eye exams.  Immunizations.  Screening for certain conditions.  Healthy lifestyle choices, such as: ? Eating a healthy diet. ? Getting regular exercise. ? Not using drugs or products that contain nicotine and tobacco. ? Limiting alcohol use. What can I expect for my preventive care visit? Physical exam Your health care provider will check your:  Height and weight. These may be used to calculate your BMI (body mass index). BMI is a measurement that tells if you are at a healthy weight.  Heart rate and blood pressure.  Body temperature.  Skin for abnormal spots. Counseling Your health care provider may ask you questions about your:  Past medical problems.  Family's medical history.  Alcohol, tobacco, and drug use.  Emotional well-being.  Home life and relationship well-being.  Sexual activity.  Diet, exercise, and sleep habits.  Work and work Statistician.  Access to firearms.  Method of birth control.  Menstrual cycle.  Pregnancy history. What immunizations do I need? Vaccines are usually given at various ages, according to a schedule. Your health care provider will recommend vaccines for you based on your age, medical history, and lifestyle or other factors, such as travel or where you work.   What tests do I need? Blood tests  Lipid and cholesterol levels. These may be checked every 5 years, or more often if you are over 3 years old.  Hepatitis C test.  Hepatitis B test. Screening  Lung cancer screening. You may have this screening every year starting at age 73 if you have a 30-pack-year history of smoking and currently smoke or have quit within the past 15 years.  Colorectal cancer  screening. ? All adults should have this screening starting at age 52 and continuing until age 17. ? Your health care provider may recommend screening at age 49 if you are at increased risk. ? You will have tests every 1-10 years, depending on your results and the type of screening test.  Diabetes screening. ? This is done by checking your blood sugar (glucose) after you have not eaten for a while (fasting). ? You may have this done every 1-3 years.  Mammogram. ? This may be done every 1-2 years. ? Talk with your health care provider about when you should start having regular mammograms. This may depend on whether you have a family history of breast cancer.  BRCA-related cancer screening. This may be done if you have a family history of breast, ovarian, tubal, or peritoneal cancers.  Pelvic exam and Pap test. ? This may be done every 3 years starting at age 10. ? Starting at age 11, this may be done every 5 years if you have a Pap test in combination with an HPV test. Other tests  STD (sexually transmitted disease) testing, if you are at risk.  Bone density scan. This is done to screen for osteoporosis. You may have this scan if you are at high risk for osteoporosis. Talk with your health care provider about your test results, treatment options, and if necessary, the need for more tests. Follow these instructions at home: Eating and drinking  Eat a diet that includes fresh fruits and vegetables, whole grains, lean protein, and low-fat dairy products.  Take vitamin and mineral supplements  as recommended by your health care provider.  Do not drink alcohol if: ? Your health care provider tells you not to drink. ? You are pregnant, may be pregnant, or are planning to become pregnant.  If you drink alcohol: ? Limit how much you have to 0-1 drink a day. ? Be aware of how much alcohol is in your drink. In the [place] one drink equals one 12 oz bottle of beer (355 mL), one 5 oz glass of  wine (148 mL), or one 1 oz glass of hard liquor (44 mL).   Lifestyle  Take daily care of your teeth and gums. Brush your teeth every morning and night with fluoride toothpaste. Floss one time each day.  Stay active. Exercise for at least 30 minutes 5 or more days each week.  Do not use any products that contain nicotine or tobacco, such as cigarettes, e-cigarettes, and chewing tobacco. If you need help quitting, ask your health care provider.  Do not use drugs.  If you are sexually active, practice safe sex. Use a condom or other form of protection to prevent STIs (sexually transmitted infections).  If you do not wish to become pregnant, use a form of birth control. If you plan to become pregnant, see your health care provider for a prepregnancy visit.  If told by your health care provider, take low-dose aspirin daily starting at age 50.  Find healthy ways to cope with stress, such as: ? Meditation, yoga, or listening to music. ? Journaling. ? Talking to a trusted person. ? Spending time with friends and family. Safety  Always wear your seat belt while driving or riding in a vehicle.  Do not drive: ? If you have been drinking alcohol. Do not ride with someone who has been drinking. ? When you are tired or distracted. ? While texting.  Wear a helmet and other protective equipment during sports activities.  If you have firearms in your house, make sure you follow all gun safety procedures. What's next?  Visit your health care provider once a year for an annual wellness visit.  Ask your health care provider how often you should have your eyes and teeth checked.  Stay up to date on all vaccines. This information is not intended to replace advice given to you by your health care provider. Make sure you discuss any questions you have with your health care provider. Document Revised: 10/08/2019 Document Reviewed: 09/14/2017 Elsevier Patient Education  2021 [employer]  

## 2020-05-07 NOTE — Progress Notes (Signed)
Subjective:     Dawn Lutz is a 45 y.o. female and is here for a comprehensive physical exam. The patient reports no problems.  Social History   Socioeconomic History  . Marital status: Single    Spouse name: Not on file  . Number of children: Not on file  . Years of education: Not on file  . Highest education level: Not on file  Occupational History  . Not on file  Tobacco Use  . Smoking status: Never Smoker  . Smokeless tobacco: Never Used  Vaping Use  . Vaping Use: Never used  Substance and Sexual Activity  . Alcohol use: Yes    Alcohol/week: 7.0 standard drinks    Types: 7 Cans of beer per week  . Drug use: No  . Sexual activity: Yes    Birth control/protection: Pill  Other Topics Concern  . Not on file  Social History Narrative  . Not on file   Social Determinants of Health   Financial Resource Strain: Not on file  Food Insecurity: Not on file  Transportation Needs: Not on file  Physical Activity: Not on file  Stress: Not on file  Social Connections: Not on file  Intimate Partner Violence: Not on file   Health Maintenance  Topic Date Due  . Hepatitis C Screening  Never done  . COVID-19 Vaccine (1) Never done  . HIV Screening  Never done  . COLONOSCOPY (Pts 45-52yrs Insurance coverage will need to be confirmed)  Never done  . PAP SMEAR-Modifier  03/23/2020  . INFLUENZA VACCINE  08/17/2020  . TETANUS/TDAP  08/07/2023  . HPV VACCINES  Aged Out    The following portions of the patient's history were reviewed and updated as appropriate: allergies, current medications, past family history, past medical history, past social history, past surgical history and problem list.  Review of Systems Pertinent items noted in HPI and remainder of comprehensive ROS otherwise negative.   Objective:    BP 114/75   Pulse 77   Temp 98.5 F (36.9 C)   Ht 5\' 5"  (1.651 m)   Wt 204 lb 4.8 oz (92.7 kg)   LMP  (LMP Unknown)   SpO2 100%   BMI 34.00 kg/m  General  appearance: alert, cooperative and no distress Head: Normocephalic, without obvious abnormality, atraumatic Eyes: conjunctivae/corneas clear. PERRL, EOM's intact. Fundi benign. Ears: normal TM's and external ear canals both ears Nose: Nares normal. Septum midline. Mucosa normal. No drainage or sinus tenderness. Throat: lips, mucosa, and tongue normal; teeth and gums normal Neck: no adenopathy, no carotid bruit, no JVD, supple, symmetrical, trachea midline and thyroid not enlarged, symmetric, no tenderness/mass/nodules Back: symmetric, no curvature. ROM normal. No CVA tenderness. Lungs: clear to auscultation bilaterally Breasts: normal appearance, no masses or tenderness, dense breast tissue Heart: regular rate and rhythm, S1, S2 normal, no murmur, click, rub or gallop Abdomen: soft, non-tender; bowel sounds normal; no masses,  no organomegaly Pelvic: cervix normal in appearance, external genitalia normal, no adnexal masses or tenderness, no cervical motion tenderness, uterus normal size, shape, and consistency and vagina normal with scant white discharge Extremities: extremities normal, atraumatic, no cyanosis or edema Pulses: 2+ and symmetric Skin: Skin color, texture, turgor normal. No rashes or lesions Lymph nodes: Cervical adenopathy: mildly enlarged submandibular gland and Supraclavicular adenopathy: normal Neurologic: Grossly normal    Assessment:    Healthy female exam.     Plan:  -Continue current medication regimen. -Pt UTD mammogram, Tdap. Declined Hep C and HIV screenings. -  Will notify of lab results and pap smear results when available.  -Will place orders for screening colonoscopy.  -Continue good hydration, follow a heart healthy diet and recommend 150 mins/wk of moderate physical activity. -Follow up in 6 months for Mood   See After Visit Summary for Counseling Recommendations

## 2020-05-08 LAB — COMPREHENSIVE METABOLIC PANEL
ALT: 15 IU/L (ref 0–32)
AST: 29 IU/L (ref 0–40)
Albumin/Globulin Ratio: 1.6 (ref 1.2–2.2)
Albumin: 4.1 g/dL (ref 3.8–4.8)
Alkaline Phosphatase: 92 IU/L (ref 44–121)
BUN/Creatinine Ratio: 12 (ref 9–23)
BUN: 9 mg/dL (ref 6–24)
Bilirubin Total: 0.4 mg/dL (ref 0.0–1.2)
CO2: 20 mmol/L (ref 20–29)
Calcium: 9.1 mg/dL (ref 8.7–10.2)
Chloride: 100 mmol/L (ref 96–106)
Creatinine, Ser: 0.73 mg/dL (ref 0.57–1.00)
Globulin, Total: 2.5 g/dL (ref 1.5–4.5)
Glucose: 78 mg/dL (ref 65–99)
Potassium: 4.7 mmol/L (ref 3.5–5.2)
Sodium: 140 mmol/L (ref 134–144)
Total Protein: 6.6 g/dL (ref 6.0–8.5)
eGFR: 102 mL/min/{1.73_m2} (ref >59)

## 2020-05-08 LAB — CBC
Hematocrit: 41.6 % (ref 34.0–46.6)
Hemoglobin: 14.2 g/dL (ref 11.1–15.9)
MCH: 32.1 pg (ref 26.6–33.0)
MCHC: 34.1 g/dL (ref 31.5–35.7)
MCV: 94 fL (ref 79–97)
Platelets: 289 10*3/uL (ref 150–450)
RBC: 4.42 x10E6/uL (ref 3.77–5.28)
RDW: 12.3 % (ref 11.7–15.4)
WBC: 11.2 10*3/uL — ABNORMAL HIGH (ref 3.4–10.8)

## 2020-05-08 LAB — CYTOLOGY - PAP
Adequacy: ABSENT
Comment: NEGATIVE
Diagnosis: NEGATIVE
High risk HPV: NEGATIVE

## 2020-05-08 LAB — LIPID PANEL
Chol/HDL Ratio: 2.1 ratio (ref 0.0–4.4)
Cholesterol, Total: 186 mg/dL (ref 100–199)
HDL: 89 mg/dL (ref >39)
LDL Chol Calc (NIH): 79 mg/dL (ref 0–99)
Triglycerides: 104 mg/dL (ref 0–149)
VLDL Cholesterol Cal: 18 mg/dL (ref 5–40)

## 2020-05-08 LAB — TSH: TSH: 4.38 u[IU]/mL (ref 0.450–4.500)

## 2020-05-08 LAB — HEMOGLOBIN A1C
Est. average glucose Bld gHb Est-mCnc: 103 mg/dL
Hgb A1c MFr Bld: 5.2 % (ref 4.8–5.6)

## 2020-05-20 ENCOUNTER — Encounter: Payer: Self-pay | Admitting: *Deleted

## 2020-06-18 ENCOUNTER — Other Ambulatory Visit: Payer: Self-pay | Admitting: Physician Assistant

## 2020-06-18 DIAGNOSIS — N951 Menopausal and female climacteric states: Secondary | ICD-10-CM

## 2020-06-27 ENCOUNTER — Other Ambulatory Visit: Payer: Self-pay | Admitting: Physician Assistant

## 2020-09-23 ENCOUNTER — Other Ambulatory Visit: Payer: Self-pay | Admitting: Physician Assistant

## 2020-11-06 ENCOUNTER — Ambulatory Visit: Payer: BC Managed Care – PPO | Admitting: Physician Assistant

## 2020-11-16 ENCOUNTER — Other Ambulatory Visit: Payer: Self-pay | Admitting: Physician Assistant

## 2020-11-16 DIAGNOSIS — N951 Menopausal and female climacteric states: Secondary | ICD-10-CM

## 2020-12-22 ENCOUNTER — Other Ambulatory Visit: Payer: Self-pay | Admitting: Physician Assistant

## 2021-01-11 ENCOUNTER — Other Ambulatory Visit: Payer: Self-pay | Admitting: Physician Assistant

## 2021-01-11 DIAGNOSIS — N951 Menopausal and female climacteric states: Secondary | ICD-10-CM

## 2021-03-22 ENCOUNTER — Other Ambulatory Visit: Payer: Self-pay | Admitting: Physician Assistant

## 2021-04-28 ENCOUNTER — Other Ambulatory Visit: Payer: Self-pay

## 2021-04-28 DIAGNOSIS — Z130 Encounter for screening for diseases of the blood and blood-forming organs and certain disorders involving the immune mechanism: Secondary | ICD-10-CM

## 2021-04-28 DIAGNOSIS — Z0000 Encounter for general adult medical examination without abnormal findings: Secondary | ICD-10-CM

## 2021-05-03 ENCOUNTER — Other Ambulatory Visit: Payer: Self-pay | Admitting: Physician Assistant

## 2021-05-03 ENCOUNTER — Other Ambulatory Visit: Payer: BC Managed Care – PPO

## 2021-05-03 DIAGNOSIS — N951 Menopausal and female climacteric states: Secondary | ICD-10-CM

## 2021-05-03 DIAGNOSIS — Z0000 Encounter for general adult medical examination without abnormal findings: Secondary | ICD-10-CM

## 2021-05-03 DIAGNOSIS — Z130 Encounter for screening for diseases of the blood and blood-forming organs and certain disorders involving the immune mechanism: Secondary | ICD-10-CM

## 2021-06-21 ENCOUNTER — Other Ambulatory Visit: Payer: Self-pay | Admitting: Physician Assistant

## 2021-06-28 ENCOUNTER — Other Ambulatory Visit: Payer: Self-pay | Admitting: Physician Assistant

## 2021-06-28 DIAGNOSIS — N951 Menopausal and female climacteric states: Secondary | ICD-10-CM

## 2021-07-19 ENCOUNTER — Other Ambulatory Visit: Payer: Self-pay | Admitting: Internal Medicine

## 2021-07-19 DIAGNOSIS — Z7689 Persons encountering health services in other specified circumstances: Secondary | ICD-10-CM | POA: Diagnosis not present

## 2021-07-19 DIAGNOSIS — M1712 Unilateral primary osteoarthritis, left knee: Secondary | ICD-10-CM | POA: Diagnosis not present

## 2021-07-19 DIAGNOSIS — Z1231 Encounter for screening mammogram for malignant neoplasm of breast: Secondary | ICD-10-CM

## 2021-07-19 DIAGNOSIS — L409 Psoriasis, unspecified: Secondary | ICD-10-CM | POA: Diagnosis not present

## 2021-08-02 ENCOUNTER — Ambulatory Visit
Admission: RE | Admit: 2021-08-02 | Discharge: 2021-08-02 | Disposition: A | Discharge status: Home / Self Care | Payer: BC Managed Care – PPO | Source: Ambulatory Visit | Attending: Internal Medicine | Admitting: Internal Medicine

## 2021-08-02 DIAGNOSIS — Z1231 Encounter for screening mammogram for malignant neoplasm of breast: Secondary | ICD-10-CM | POA: Insufficient documentation

## 2021-08-05 ENCOUNTER — Other Ambulatory Visit: Payer: Self-pay | Admitting: Internal Medicine

## 2021-08-05 DIAGNOSIS — N6489 Other specified disorders of breast: Secondary | ICD-10-CM

## 2021-08-05 DIAGNOSIS — R928 Other abnormal and inconclusive findings on diagnostic imaging of breast: Secondary | ICD-10-CM

## 2021-08-23 ENCOUNTER — Ambulatory Visit
Admission: RE | Admit: 2021-08-23 | Discharge: 2021-08-23 | Disposition: A | Discharge status: Home / Self Care | Payer: BC Managed Care – PPO | Source: Ambulatory Visit | Attending: Internal Medicine | Admitting: Internal Medicine

## 2021-08-23 DIAGNOSIS — R928 Other abnormal and inconclusive findings on diagnostic imaging of breast: Secondary | ICD-10-CM | POA: Diagnosis not present

## 2021-08-23 DIAGNOSIS — N6310 Unspecified lump in the right breast, unspecified quadrant: Secondary | ICD-10-CM | POA: Diagnosis not present

## 2021-08-23 DIAGNOSIS — N6489 Other specified disorders of breast: Secondary | ICD-10-CM | POA: Insufficient documentation

## 2021-11-12 DIAGNOSIS — Z13220 Encounter for screening for lipoid disorders: Secondary | ICD-10-CM | POA: Diagnosis not present

## 2021-11-12 DIAGNOSIS — R8290 Unspecified abnormal findings in urine: Secondary | ICD-10-CM | POA: Diagnosis not present

## 2021-11-12 DIAGNOSIS — Z131 Encounter for screening for diabetes mellitus: Secondary | ICD-10-CM | POA: Diagnosis not present

## 2021-11-12 DIAGNOSIS — G8929 Other chronic pain: Secondary | ICD-10-CM | POA: Diagnosis not present

## 2021-11-12 DIAGNOSIS — M25512 Pain in left shoulder: Secondary | ICD-10-CM | POA: Diagnosis not present

## 2021-11-12 DIAGNOSIS — M1712 Unilateral primary osteoarthritis, left knee: Secondary | ICD-10-CM | POA: Diagnosis not present

## 2021-11-12 DIAGNOSIS — L409 Psoriasis, unspecified: Secondary | ICD-10-CM | POA: Diagnosis not present

## 2021-11-19 DIAGNOSIS — F3340 Major depressive disorder, recurrent, in remission, unspecified: Secondary | ICD-10-CM | POA: Diagnosis not present

## 2021-11-19 DIAGNOSIS — M779 Enthesopathy, unspecified: Secondary | ICD-10-CM | POA: Diagnosis not present

## 2021-11-19 DIAGNOSIS — J060 Acute laryngopharyngitis: Secondary | ICD-10-CM | POA: Diagnosis not present

## 2021-11-19 DIAGNOSIS — M25561 Pain in right knee: Secondary | ICD-10-CM | POA: Diagnosis not present

## 2022-10-12 ENCOUNTER — Other Ambulatory Visit: Payer: Self-pay | Admitting: Internal Medicine

## 2022-10-12 DIAGNOSIS — Z1231 Encounter for screening mammogram for malignant neoplasm of breast: Secondary | ICD-10-CM

## 2022-10-19 ENCOUNTER — Ambulatory Visit
Admission: RE | Admit: 2022-10-19 | Discharge: 2022-10-19 | Disposition: A | Discharge status: Home / Self Care | Payer: BC Managed Care – PPO | Source: Ambulatory Visit | Attending: Internal Medicine | Admitting: Internal Medicine

## 2022-10-19 DIAGNOSIS — Z1231 Encounter for screening mammogram for malignant neoplasm of breast: Secondary | ICD-10-CM | POA: Diagnosis present

## 2022-10-24 ENCOUNTER — Other Ambulatory Visit: Payer: Self-pay | Admitting: Internal Medicine

## 2022-10-24 DIAGNOSIS — R928 Other abnormal and inconclusive findings on diagnostic imaging of breast: Secondary | ICD-10-CM

## 2022-11-01 ENCOUNTER — Ambulatory Visit
Admission: RE | Admit: 2022-11-01 | Discharge: 2022-11-01 | Disposition: A | Discharge status: Home / Self Care | Payer: BC Managed Care – PPO | Source: Ambulatory Visit | Attending: Internal Medicine | Admitting: Internal Medicine

## 2022-11-01 DIAGNOSIS — R928 Other abnormal and inconclusive findings on diagnostic imaging of breast: Secondary | ICD-10-CM | POA: Insufficient documentation

## 2022-11-03 ENCOUNTER — Other Ambulatory Visit: Payer: Self-pay | Admitting: Internal Medicine

## 2022-11-03 DIAGNOSIS — R928 Other abnormal and inconclusive findings on diagnostic imaging of breast: Secondary | ICD-10-CM

## 2022-11-03 DIAGNOSIS — N63 Unspecified lump in unspecified breast: Secondary | ICD-10-CM

## 2022-11-09 ENCOUNTER — Ambulatory Visit
Admission: RE | Admit: 2022-11-09 | Discharge: 2022-11-09 | Disposition: A | Discharge status: Home / Self Care | Payer: BC Managed Care – PPO | Source: Ambulatory Visit | Attending: Internal Medicine | Admitting: Internal Medicine

## 2022-11-09 DIAGNOSIS — R928 Other abnormal and inconclusive findings on diagnostic imaging of breast: Secondary | ICD-10-CM | POA: Diagnosis present

## 2022-11-09 DIAGNOSIS — N63 Unspecified lump in unspecified breast: Secondary | ICD-10-CM

## 2022-11-09 DIAGNOSIS — N6022 Fibroadenosis of left breast: Secondary | ICD-10-CM | POA: Insufficient documentation

## 2022-11-09 HISTORY — PX: BREAST BIOPSY: SHX20

## 2022-11-09 MED ORDER — LIDOCAINE 1 % OPTIME INJ - NO CHARGE
2.0000 mL | Freq: Once | INTRAMUSCULAR | Status: AC
  Administered 2022-11-09: 2 mL
  Filled 2022-11-09: qty 2

## 2022-11-09 MED ORDER — LIDOCAINE-EPINEPHRINE 1 %-1:100000 IJ SOLN
5.0000 mL | Freq: Once | INTRAMUSCULAR | Status: DC
  Filled 2022-11-09: qty 5

## 2022-11-10 LAB — SURGICAL PATHOLOGY

## 2022-11-14 IMAGING — MR MR KNEE*R* W/O CM
4 of 6 series · 19 of 40 positions shown · non-contrast
Comparison: Plain films right knee 11/04/2019.

CLINICAL DATA: Chronic right knee pain, worse over the past year.
No recent injury.

EXAM:
MRI OF THE RIGHT KNEE WITHOUT CONTRAST
TECHNIQUE: Multiplanar, multisequence MR imaging of the knee was performed. No
intravenous contrast was administered.

[Series 5: T2 fat-sat · axial · 4.0mm · 0.29mm/px · z∈[-23,+52]mm · 3 of 27 slices shown (1 of 2)]
[im 5/27]
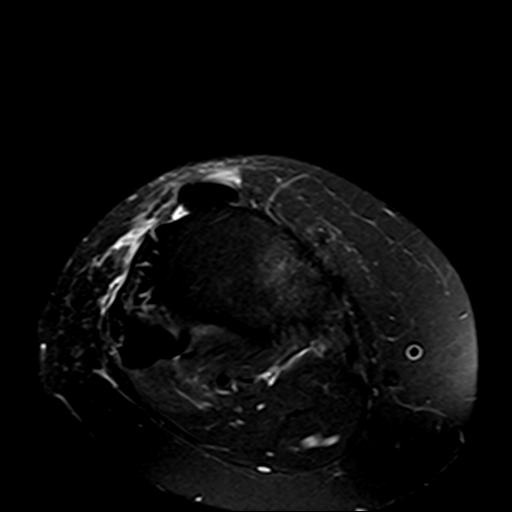
[im 14/27]
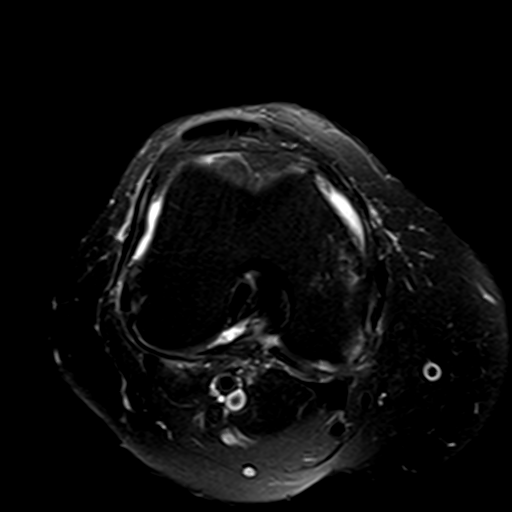
[im 22/27]
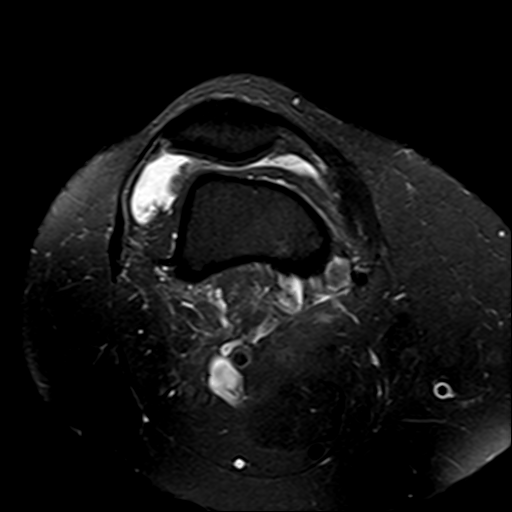

[Series 6: T2 fat-sat · coronal · 4.0mm · 0.29mm/px · 3 of 25 slices shown (2 of 2)]
[im 5/25]
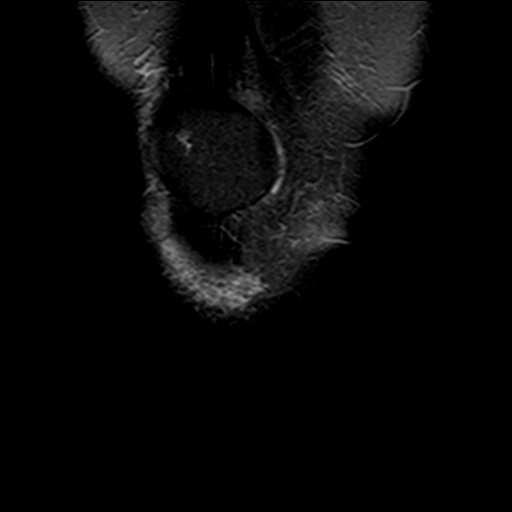
[im 15/25]
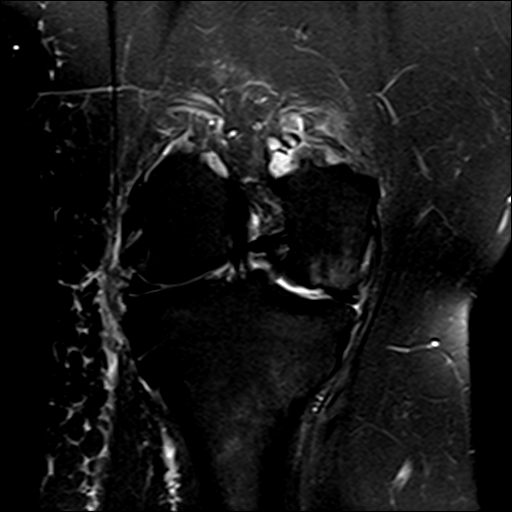
[im 25/25]
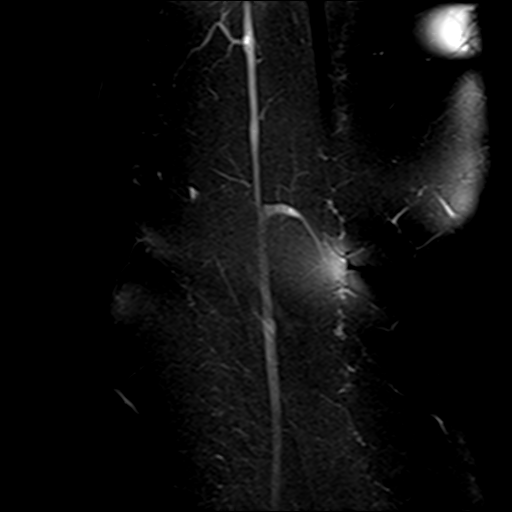

[Series 7: PD fat-sat · coronal · 3.0mm · 0.29mm/px · 7 of 28 slices shown (1 of 2)]
[im 1/28]
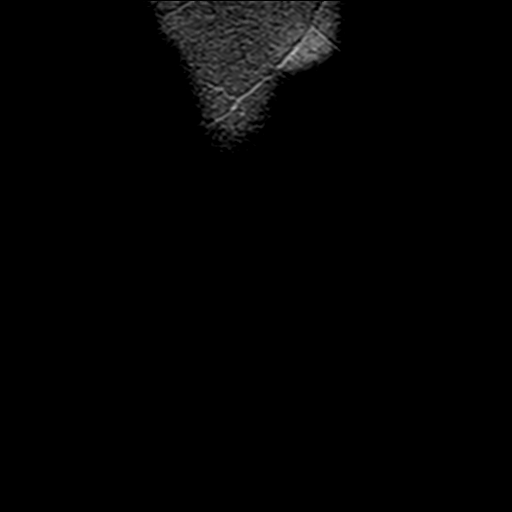
[im 5/28]
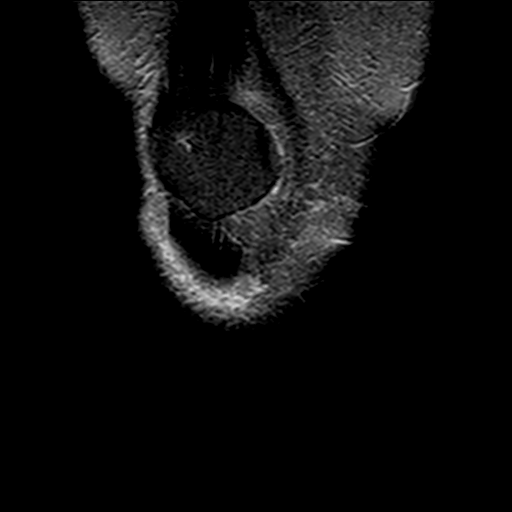
[im 10/28]
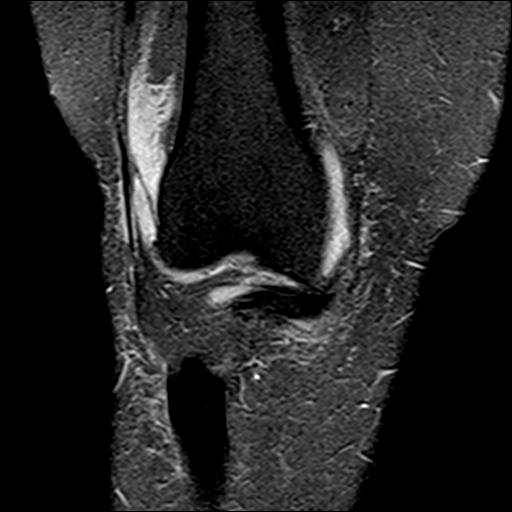
[im 14/28]
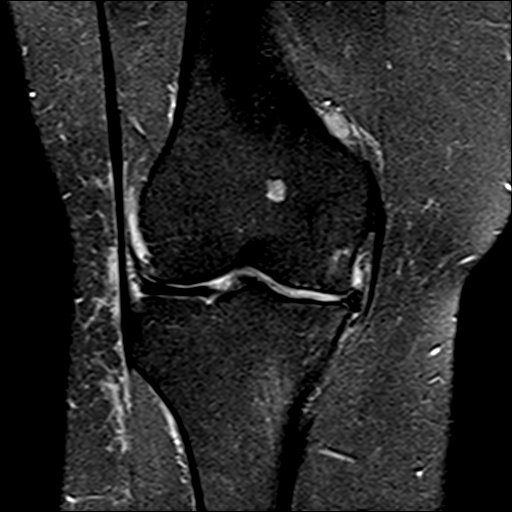
[im 19/28]
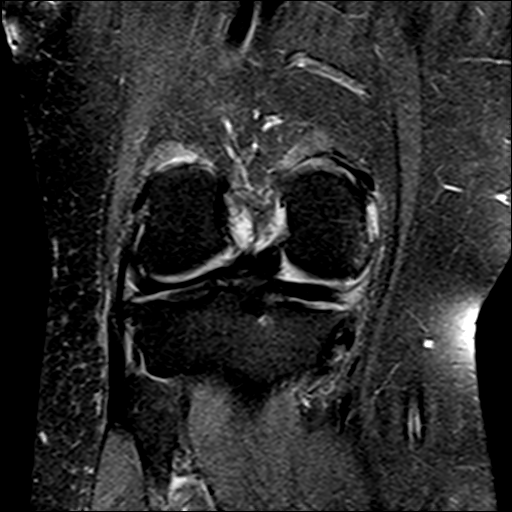
[im 23/28]
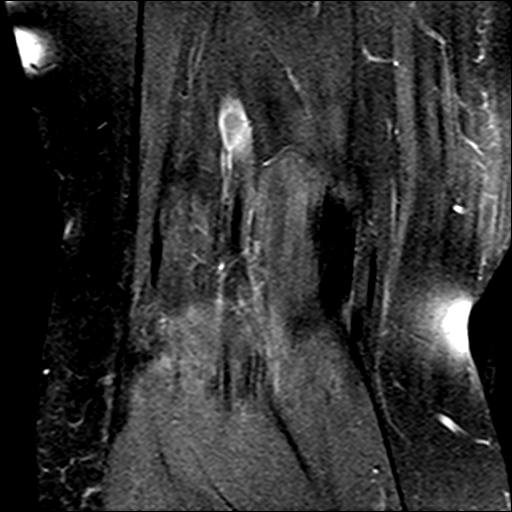
[im 28/28]
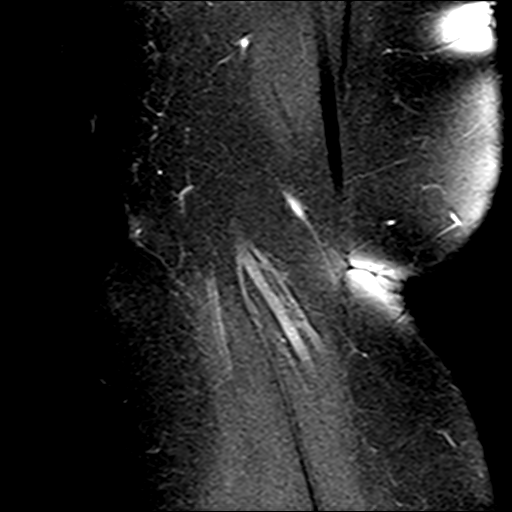

[Series 8: PD fat-sat · sagittal · 3.0mm · 0.29mm/px · 6 of 30 slices shown (2 of 2)]
[im 1/30]
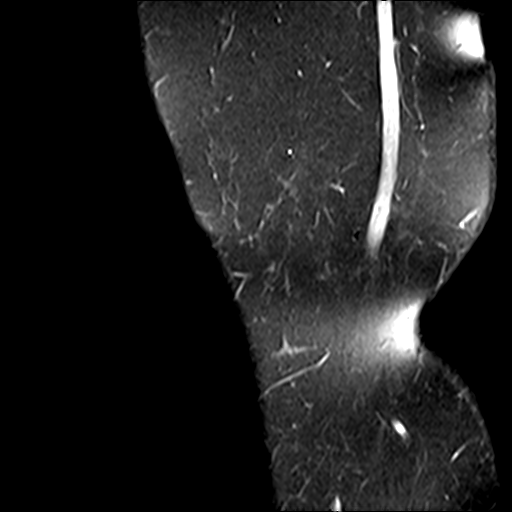
[im 5/30]
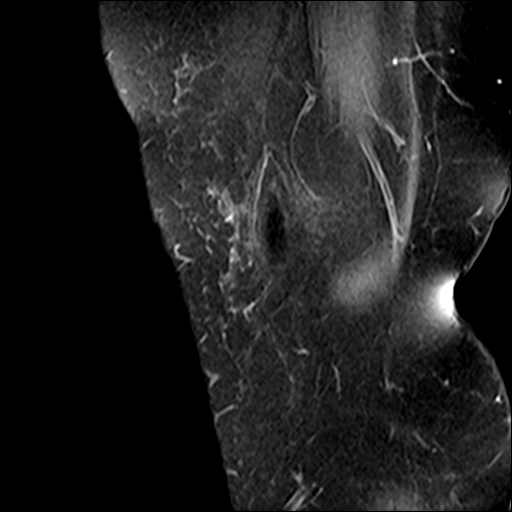
[im 10/30]
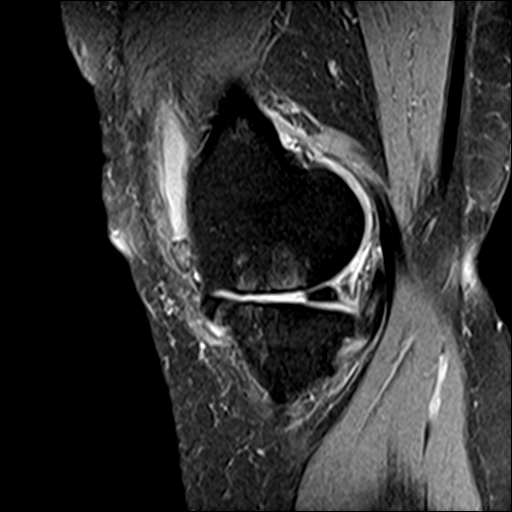
[im 15/30]
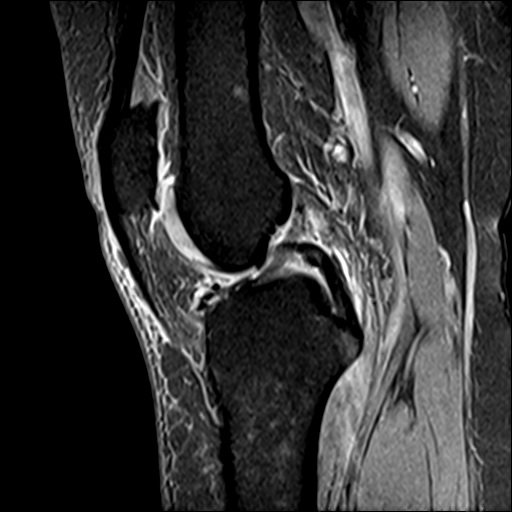
[im 20/30]
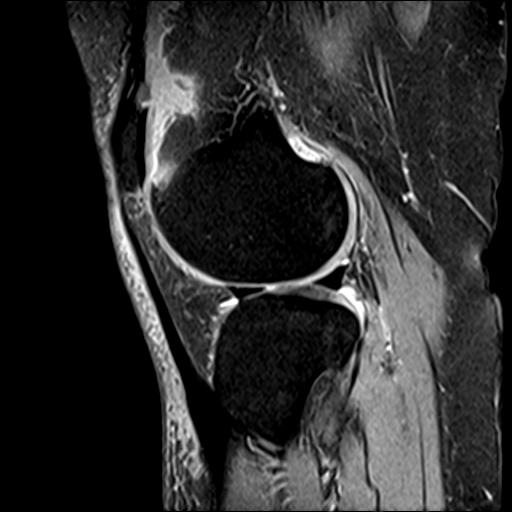
[im 25/30]
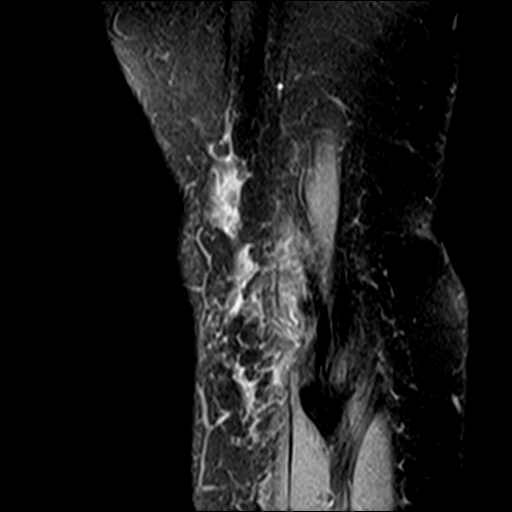

[19 of 40 positions shown; findings below may reference images not displayed]

FINDINGS: MENISCI

Medial meniscus: There is blunting along the free edge of the
posterior horn throughout compatible with radial tearing. The body
is degenerated and somewhat diminutive.

Lateral meniscus:  Intact.

LIGAMENTS

Cruciates:  Intact.

Collaterals:  Intact.

CARTILAGE

Patellofemoral:  Marked cartilage thinning is seen at patellar apex.

Medial: Markedly degenerated. Cartilage along the weight-bearing
medial femoral condyle and tibia is almost completely denuded. There
is associated joint space narrowing.

Lateral:  Mildly to moderately degenerated without focal defect.

Joint:  Small effusion.

Popliteal Fossa:  No Baker's cyst.

Extensor Mechanism:  Intact.

Bones: Tricompartmental osteophytosis is present. Mild subchondral
edema is seen about the medial compartment. 0.5 cm enchondroma in
the distal femur is noted.

Other: A debris containing cyst measuring approximately 1 cm in
diameter along the medial femoral metaphysis is most consistent with
a ganglion.
IMPRESSION: Advanced for age osteoarthritis is most severe in the medial
compartment.

Blunting along the free edge of the posterior horn of the medial
meniscus throughout consistent with radial tearing.
